# Patient Record
Sex: Male | Born: 1965 | Race: White | Hispanic: No | Marital: Single | Smoking: Never smoker
Health system: Southern US, Community
[De-identification: ages and names within clinical notes are randomized; demographics above are authoritative.]

## PROBLEM LIST (undated history)

## (undated) DIAGNOSIS — E7800 Pure hypercholesterolemia, unspecified: Secondary | ICD-10-CM

## (undated) DIAGNOSIS — E119 Type 2 diabetes mellitus without complications: Secondary | ICD-10-CM

## (undated) DIAGNOSIS — F410 Panic disorder [episodic paroxysmal anxiety] without agoraphobia: Secondary | ICD-10-CM

## (undated) DIAGNOSIS — G459 Transient cerebral ischemic attack, unspecified: Secondary | ICD-10-CM

## (undated) DIAGNOSIS — F419 Anxiety disorder, unspecified: Secondary | ICD-10-CM

## (undated) DIAGNOSIS — I10 Essential (primary) hypertension: Secondary | ICD-10-CM

## (undated) HISTORY — PX: APPENDECTOMY: SHX54

---
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
## 2014-11-28 ENCOUNTER — Other Ambulatory Visit: Payer: Self-pay

## 2018-04-07 ENCOUNTER — Emergency Department (HOSPITAL_COMMUNITY)
Admission: EM | Admit: 2018-04-07 | Discharge: 2018-04-08 | Disposition: A | Discharge status: Home / Self Care | Payer: Medicare Other | Attending: Emergency Medicine | Admitting: Emergency Medicine

## 2018-04-07 ENCOUNTER — Encounter (HOSPITAL_COMMUNITY): Payer: Self-pay

## 2018-04-07 DIAGNOSIS — E1165 Type 2 diabetes mellitus with hyperglycemia: Secondary | ICD-10-CM | POA: Diagnosis not present

## 2018-04-07 DIAGNOSIS — Z794 Long term (current) use of insulin: Secondary | ICD-10-CM | POA: Insufficient documentation

## 2018-04-07 DIAGNOSIS — R079 Chest pain, unspecified: Secondary | ICD-10-CM | POA: Insufficient documentation

## 2018-04-07 DIAGNOSIS — Z79899 Other long term (current) drug therapy: Secondary | ICD-10-CM | POA: Insufficient documentation

## 2018-04-07 DIAGNOSIS — R739 Hyperglycemia, unspecified: Secondary | ICD-10-CM

## 2018-04-07 DIAGNOSIS — Z7901 Long term (current) use of anticoagulants: Secondary | ICD-10-CM | POA: Diagnosis not present

## 2018-04-07 HISTORY — DX: Anxiety disorder, unspecified: F41.9

## 2018-04-07 HISTORY — DX: Essential (primary) hypertension: I10

## 2018-04-07 HISTORY — DX: Type 2 diabetes mellitus without complications: E11.9

## 2018-04-07 HISTORY — DX: Panic disorder (episodic paroxysmal anxiety): F41.0

## 2018-04-07 NOTE — ED Triage Notes (Signed)
Pt states he was seen at [facility] on Wednesday for same complaints, states had multiple tests and sent home.  Pt denies knowing what was wrong with him and continues to have chest pain and left arm numbness.

## 2018-04-08 ENCOUNTER — Emergency Department (HOSPITAL_COMMUNITY): Payer: Medicare Other

## 2018-04-08 ENCOUNTER — Encounter (HOSPITAL_COMMUNITY): Payer: Self-pay

## 2018-04-08 LAB — I-STAT CHEM 8, ED
BUN: 18 mg/dL (ref 6–20)
Calcium, Ion: 1.18 mmol/L (ref 1.15–1.40)
Chloride: 99 mmol/L (ref 98–111)
Creatinine, Ser: 1.3 mg/dL — ABNORMAL HIGH (ref 0.61–1.24)
GLUCOSE: 338 mg/dL — AB (ref 70–99)
HCT: 42 % (ref 39.0–52.0)
HEMOGLOBIN: 14.3 g/dL (ref 13.0–17.0)
Potassium: 3.3 mmol/L — ABNORMAL LOW (ref 3.5–5.1)
Sodium: 134 mmol/L — ABNORMAL LOW (ref 135–145)
TCO2: 23 mmol/L (ref 22–32)

## 2018-04-08 LAB — I-STAT TROPONIN, ED
TROPONIN I, POC: 0 ng/mL (ref 0.00–0.08)
Troponin i, poc: 0 ng/mL (ref 0.00–0.08)

## 2018-04-08 MED ORDER — PANTOPRAZOLE SODIUM 40 MG PO TBEC: 40.0000 mg | DELAYED_RELEASE_TABLET | Freq: Every day | ORAL | 3 refills | Status: DC

## 2018-04-08 NOTE — Discharge Instructions (Addendum)
See your Physician for recheck.  °

## 2018-04-08 NOTE — ED Notes (Addendum)
Dr. Blinda Leatherwood notified about potassium of 3.3

## 2018-04-08 NOTE — ED Provider Notes (Signed)
[facility] EMERGENCY DEPARTMENT Provider Note   CSN: [id number] Arrival date & time: 04/07/18  2341     History   Chief Complaint Chief Complaint  Patient presents with  . Chest Pain  . Numbness    HPI Todd Campbell is a 52 y.o. male.  The history is provided by the patient. No language interpreter was used.  Chest Pain   This is a new problem. The current episode started 2 days ago. The problem occurs constantly. The problem has been gradually worsening. The pain is associated with exertion. The pain is moderate. The pain radiates to the left shoulder and left arm. He has tried nothing for the symptoms. Risk factors include male gender and smoking/tobacco exposure.    Past Medical History:  Diagnosis Date  . Anxiety   . Diabetes mellitus without complication (HCC)   . Hypertension   . Panic attacks     There are no active problems to display for this patient.   Past Surgical History:  Procedure Laterality Date  . EYE SURGERY  1985        Home Medications    Prior to Admission medications   Medication Sig Start Date End Date Taking? Authorizing Provider  amLODipine (NORVASC) 10 MG tablet  04/06/18   [provider]  clonazePAM (KLONOPIN) 1 MG tablet  04/05/18   [provider]  clopidogrel (PLAVIX) 75 MG tablet  04/06/18   [provider]  gabapentin (NEURONTIN) 800 MG tablet 3 (three) times daily.  03/20/18   [provider]  LEVEMIR FLEXTOUCH 100 UNIT/ML Pen  04/03/18   [provider]  lisinopril-hydrochlorothiazide (PRINZIDE,ZESTORETIC) 10-12.5 MG tablet  03/13/18   [provider]  NOVOLOG MIX 70/30 FLEXPEN (70-30) 100 UNIT/ML FlexPen  03/20/18   [provider]  rosuvastatin (CRESTOR) 20 MG tablet  03/13/18   [provider]    Family History Family History  Problem Relation Age of Onset  . Diabetes Mother   . Heart attack Mother   . Diabetes Father   . Heart attack Father      Social History Social History   Tobacco Use  . Smoking status: Not on file  Substance Use Topics  . Alcohol use: Not on file  . Drug use: Not on file     Allergies   Peanut-containing drug products   Review of Systems Review of Systems  Cardiovascular: Positive for chest pain.  All other systems reviewed and are negative.    Physical Exam Updated Vital Signs BP (!) 132/91 (BP Location: Left Arm)   Pulse 89   Temp 98.3 F (36.8 C) (Oral)   Resp 18   Ht 5\' 7"  (1.702 m)   Wt 81.6 kg   SpO2 98%   BMI 28.19 kg/m   Physical Exam  Constitutional: He appears well-developed and well-nourished.  HENT:  Head: Normocephalic and atraumatic.  Eyes: Pupils are equal, round, and reactive to light. Conjunctivae and EOM are normal.  Neck: Neck supple.  Cardiovascular: Normal rate, regular rhythm and normal pulses.  No murmur heard. Pulmonary/Chest: Effort normal and breath sounds normal. No respiratory distress. He has no decreased breath sounds.  Abdominal: Soft. There is no tenderness.  Musculoskeletal: Normal range of motion. He exhibits no edema.  Neurological: He is alert.  Skin: Skin is warm and dry.  Psychiatric: He has a normal mood and affect.  Nursing note and vitals reviewed.    ED Treatments / Results  Labs (all labs ordered are  listed, but only abnormal results are displayed) Labs Reviewed  I-STAT TROPONIN, ED  I-STAT CHEM 8, ED    EKG None  Radiology Dg Chest 2 View  Result Date: 04/08/2018 CLINICAL DATA:  Chest pain. EXAM: CHEST - 2 VIEW COMPARISON:  None. FINDINGS: Size limited due to low volumes. No pneumothorax. No nodule or mass. No focal infiltrate. The cardiomediastinal silhouette is unremarkable. Mild anterior wedging of lower thoracic vertebral bodies is age indeterminate but most likely nonacute. IMPRESSION: 1. Limited low volume study with no acute abnormality in the lungs identified. 2. Mild age-indeterminate wedging of lower thoracic  vertebral bodies is favored to be nonacute. Recommend clinical correlation. Electronically Signed   By: Gerome Sam III M.D   On: 04/08/2018 00:46    Procedures Procedures (including critical care time)  Medications Ordered in ED Medications - No data to display   Initial Impression / Assessment and Plan / ED Course  I have reviewed the triage vital signs and the nursing notes.  Pertinent labs & imaging results that were available during my care of the patient were reviewed by me and considered in my medical decision making (see chart for details).       Final Clinical Impressions(s) / ED Diagnoses   Final diagnoses:  Nonspecific chest pain  Hyperglycemia    ED Discharge Orders    None       Elson Areas, [place] 04/08/18 0108    Gilda Crease, MD 04/08/18 [phone]

## 2018-05-15 ENCOUNTER — Emergency Department (HOSPITAL_COMMUNITY)
Admission: EM | Admit: 2018-05-15 | Discharge: 2018-05-16 | Disposition: A | Discharge status: Home / Self Care | Payer: Medicare Other | Attending: Emergency Medicine | Admitting: Emergency Medicine

## 2018-05-15 ENCOUNTER — Emergency Department (HOSPITAL_COMMUNITY): Payer: Medicare Other

## 2018-05-15 ENCOUNTER — Other Ambulatory Visit: Payer: Self-pay

## 2018-05-15 ENCOUNTER — Encounter (HOSPITAL_COMMUNITY): Payer: Self-pay | Admitting: *Deleted

## 2018-05-15 DIAGNOSIS — Z91010 Allergy to peanuts: Secondary | ICD-10-CM | POA: Diagnosis not present

## 2018-05-15 DIAGNOSIS — R079 Chest pain, unspecified: Secondary | ICD-10-CM | POA: Diagnosis present

## 2018-05-15 DIAGNOSIS — R1084 Generalized abdominal pain: Secondary | ICD-10-CM | POA: Diagnosis not present

## 2018-05-15 DIAGNOSIS — E119 Type 2 diabetes mellitus without complications: Secondary | ICD-10-CM | POA: Diagnosis not present

## 2018-05-15 DIAGNOSIS — Z79899 Other long term (current) drug therapy: Secondary | ICD-10-CM | POA: Insufficient documentation

## 2018-05-15 DIAGNOSIS — R0789 Other chest pain: Secondary | ICD-10-CM

## 2018-05-15 DIAGNOSIS — K920 Hematemesis: Secondary | ICD-10-CM | POA: Diagnosis not present

## 2018-05-15 DIAGNOSIS — R110 Nausea: Secondary | ICD-10-CM | POA: Insufficient documentation

## 2018-05-15 DIAGNOSIS — I10 Essential (primary) hypertension: Secondary | ICD-10-CM | POA: Diagnosis not present

## 2018-05-15 DIAGNOSIS — Z794 Long term (current) use of insulin: Secondary | ICD-10-CM | POA: Diagnosis not present

## 2018-05-15 NOTE — ED Notes (Signed)
Pt also c/o of blurry vision that started today

## 2018-05-15 NOTE — ED Triage Notes (Signed)
Pt has multiple complaints; pt c/o left side chest pain with numbness to bilateral arms; pt also c/o abdominal pain x one week; pt states he has been vomiting bright red blood that started today

## 2018-05-16 ENCOUNTER — Emergency Department (HOSPITAL_COMMUNITY): Payer: Medicare Other

## 2018-05-16 LAB — CBC
HEMATOCRIT: 36.5 % — AB (ref 39.0–52.0)
Hemoglobin: 11.7 g/dL — ABNORMAL LOW (ref 13.0–17.0)
MCH: 27.6 pg (ref 26.0–34.0)
MCHC: 32.1 g/dL (ref 30.0–36.0)
MCV: 86.1 fL (ref 80.0–100.0)
NRBC: 0 % (ref 0.0–0.2)
PLATELETS: 204 10*3/uL (ref 150–400)
RBC: 4.24 MIL/uL (ref 4.22–5.81)
RDW: 12.7 % (ref 11.5–15.5)
WBC: 6.6 10*3/uL (ref 4.0–10.5)

## 2018-05-16 LAB — COMPREHENSIVE METABOLIC PANEL
ALBUMIN: 3.3 g/dL — AB (ref 3.5–5.0)
ALT: 23 U/L (ref 0–44)
ANION GAP: 7 (ref 5–15)
AST: 17 U/L (ref 15–41)
Alkaline Phosphatase: 76 U/L (ref 38–126)
BUN: 17 mg/dL (ref 6–20)
CO2: 21 mmol/L — AB (ref 22–32)
Calcium: 8.4 mg/dL — ABNORMAL LOW (ref 8.9–10.3)
Chloride: 108 mmol/L (ref 98–111)
Creatinine, Ser: 1.45 mg/dL — ABNORMAL HIGH (ref 0.61–1.24)
GFR calc Af Amer: >60 mL/min (ref >60)
GFR calc non Af Amer: 55 mL/min — ABNORMAL LOW (ref >60)
GLUCOSE: 347 mg/dL — AB (ref 70–99)
POTASSIUM: 3.8 mmol/L (ref 3.5–5.1)
Sodium: 136 mmol/L (ref 135–145)
Total Bilirubin: 0.5 mg/dL (ref 0.3–1.2)
Total Protein: 6.2 g/dL — ABNORMAL LOW (ref 6.5–8.1)

## 2018-05-16 LAB — I-STAT TROPONIN, ED: Troponin i, poc: 0 ng/mL (ref 0.00–0.08)

## 2018-05-16 LAB — LIPASE, BLOOD: Lipase: 76 U/L — ABNORMAL HIGH (ref 11–51)

## 2018-05-16 LAB — APTT: APTT: 27 s (ref 24–36)

## 2018-05-16 LAB — PROTIME-INR
INR: 0.92
PROTHROMBIN TIME: 12.3 s (ref 11.4–15.2)

## 2018-05-16 LAB — POC OCCULT BLOOD, ED: Fecal Occult Bld: NEGATIVE

## 2018-05-16 MED ORDER — IOPAMIDOL (ISOVUE-300) INJECTION 61%
100.0000 mL | Freq: Once | INTRAVENOUS | Status: AC | PRN
  Administered 2018-05-16: 100 mL via INTRAVENOUS

## 2018-05-16 MED ORDER — OMEPRAZOLE 20 MG PO CPDR: DELAYED_RELEASE_CAPSULE | ORAL | 0 refills | Status: DC

## 2018-05-16 MED ORDER — CYCLOBENZAPRINE HCL 10 MG PO TABS
5.0000 mg | ORAL_TABLET | Freq: Once | ORAL | Status: AC
  Administered 2018-05-16: 5 mg via ORAL
  Filled 2018-05-16: qty 1

## 2018-05-16 MED ORDER — CYCLOBENZAPRINE HCL 5 MG PO TABS: 5.0000 mg | ORAL_TABLET | Freq: Three times a day (TID) | ORAL | 0 refills | Status: DC | PRN

## 2018-05-16 NOTE — ED Provider Notes (Signed)
[facility] EMERGENCY DEPARTMENT Provider Note   CSN: [id number] Arrival date & time: 05/15/18  2324  Time seen 23:52 PM   History   Chief Complaint Chief Complaint  Patient presents with  . Chest Pain    HPI Todd Campbell is a 52 y.o. male.  HPI patient reports he has had chest pain that started 2 days ago.  He points to his left mid central chest as the area that hurts.  He describes it as aching and throbbing.  He states the pain has been there constantly since it started.  Nothing makes it feel worse, nothing makes it feel better.  He is tried nothing for the pain.  He states he has had shortness of breath today.  He also states he has had a cough for the past 4 to 5 days and is coughing up yellow-brown mucus.  He states he had similar pain about a month ago otherwise he is never had it before.  He states today he has had nausea and vomiting twice and he states both times he coughed up at least a cup of blood.  He denies any melena.  He states he has had watery diarrhea x3 today.  He describes central and left upper quadrant abdominal pain for the past 3 days that is there constantly.  He denies any alcohol use.  He states he has had similar pain but this is worse.  He also states both arms feel numb today but this is worse.  He states he gets tingling of his fingers but gabapentin helps that.  Patient states he was admitted to [facility] in [place] on October 12 for pneumonia.  He was seen in our ED on October 5 after he had been admitted to [facility] to evaluate him for chest pain and numbness.  He states "they do not do anything".  However he had a pretty thorough evaluation.  They did a neurology consultation, head CT, MRI brain, and EEG.  These were all normal.  He also was evaluated for his chest pain with serial EKGs and and troponins.  He states he has had diabetes about 5 years and he is on insulin.  Although Plavix is listed as 1 of his medications he states he  has never been on it.  He states his mother had a "mild heart attack".  And diabetes runs on both sides of his family.  Patient himself has never had a diagnosis of coronary artery disease, TIA, or stroke.  PCP Joette Catching in [place]  Past Medical History:  Diagnosis Date  . Anxiety   . Diabetes mellitus without complication (HCC)   . Hypertension   . Panic attacks     There are no active problems to display for this patient.   Past Surgical History:  Procedure Laterality Date  . EYE SURGERY  1985        Home Medications    Prior to Admission medications   Medication Sig Start Date End Date Taking? Authorizing Provider  amLODipine (NORVASC) 10 MG tablet  04/06/18   [provider]  clonazePAM (KLONOPIN) 1 MG tablet  04/05/18   [provider]  clopidogrel (PLAVIX) 75 MG tablet  04/06/18   [provider]  cyclobenzaprine (FLEXERIL) 5 MG tablet Take 1 tablet (5 mg total) by mouth 3 (three) times daily as needed. 05/16/18   Devoria Albe, MD  gabapentin (NEURONTIN) 800 MG tablet 3 (three) times daily.  03/20/18   [provider]  LEVEMIR FLEXTOUCH 100 UNIT/ML Pen  04/03/18   [provider]  lisinopril-hydrochlorothiazide (PRINZIDE,ZESTORETIC) 10-12.5 MG tablet  03/13/18   [provider]  NOVOLOG MIX 70/30 FLEXPEN (70-30) 100 UNIT/ML FlexPen  03/20/18   [provider]  omeprazole (PRILOSEC) 20 MG capsule Take 1 po BID x 2 weeks then once a day 05/16/18   Devoria Albe, MD  pantoprazole (PROTONIX) 40 MG tablet Take 1 tablet (40 mg total) by mouth daily. 04/08/18   Gilda Crease, MD  rosuvastatin (CRESTOR) 20 MG tablet  03/13/18   [provider]    Family History Family History  Problem Relation Age of Onset  . Diabetes Mother   . Heart attack Mother   . Diabetes Father   . Heart attack Father     Social History Social History   Tobacco Use  . Smoking status: Never Smoker  . Smokeless tobacco: Current  User    Types: Chew  Substance Use Topics  . Alcohol use: Not on file  . Drug use: Not on file  Patient has been on disability for 5 years because of panic attacks.  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  He denies smoking however he is noted to have tobacco chew in his mouth   Allergies   Peanut-containing drug products   Review of Systems Review of Systems  All other systems reviewed and are negative.    Physical Exam Updated Vital Signs BP 102/65   Pulse 73   Temp (!) 97.5 F (36.4 C) (Oral)   Resp (!) 21   Ht 5\' 7"  (1.702 m)   Wt 86.2 kg   SpO2 97%   BMI 29.76 kg/m   Vital signs normal    Physical Exam  Constitutional: He is oriented to person, place, and time. He appears well-developed and well-nourished.  Non-toxic appearance. He does not appear ill. No distress.  HENT:  Head: Normocephalic and atraumatic.  Right Ear: External ear normal.  Left Ear: External ear normal.  Nose: Nose normal. No mucosal edema or rhinorrhea.  Mouth/Throat: Oropharynx is clear and moist and mucous membranes are normal. No dental abscesses or uvula swelling.  Eyes: Pupils are equal, round, and reactive to light. Conjunctivae and EOM are normal.  Neck: Normal range of motion and full passive range of motion without pain. Neck supple.  Cardiovascular: Normal rate, regular rhythm and normal heart sounds. Exam reveals no gallop and no friction rub.  No murmur heard. Pulmonary/Chest: Effort normal and breath sounds normal. No respiratory distress. He has no wheezes. He has no rhonchi. He has no rales. He exhibits no tenderness and no crepitus.    Abdominal: Soft. Normal appearance and bowel sounds are normal. He exhibits no distension. There is no tenderness. There is no rebound and no guarding.    Musculoskeletal: Normal range of motion. He exhibits no edema or tenderness.  Moves all extremities well.   Neurological: He is alert and oriented to person, place,  and time. He has normal strength. No cranial nerve deficit.  Skin: Skin is warm, dry and intact. No rash noted. No erythema. No pallor.  Patient is noted to have multiple fresh appearing needlesticks in both antecubital spaces.  When I asked the patient about it he states those are from blood draws he had done in October when he was admitted to [facility] for pneumonia.  He also was noted to have tape marks in the same areas.  These needle marks look very fresh.  Psychiatric: He has a normal mood and affect. His speech is normal and behavior is normal. His mood appears not anxious.  Nursing note and vitals reviewed.    ED Treatments / Results  Labs (all labs ordered are listed, but only abnormal results are displayed)  Results for orders placed or performed during the hospital encounter of 05/15/18  CBC  Result Value Ref Range   WBC 6.6 4.0 - 10.5 K/uL   RBC 4.24 4.22 - 5.81 MIL/uL   Hemoglobin 11.7 (L) 13.0 - 17.0 g/dL   HCT 69.636.5 (L) 29.539.0 - 28.452.0 %   MCV 86.1 80.0 - 100.0 fL   MCH 27.6 26.0 - 34.0 pg   MCHC 32.1 30.0 - 36.0 g/dL   RDW 13.212.7 44.011.5 - 10.215.5 %   Platelets 204 150 - 400 K/uL   nRBC 0.0 0.0 - 0.2 %  Comprehensive metabolic panel  Result Value Ref Range   Sodium 136 135 - 145 mmol/L   Potassium 3.8 3.5 - 5.1 mmol/L   Chloride 108 98 - 111 mmol/L   CO2 21 (L) 22 - 32 mmol/L   Glucose, Bld 347 (H) 70 - 99 mg/dL   BUN 17 6 - 20 mg/dL   Creatinine, Ser 7.251.45 (H) 0.61 - 1.24 mg/dL   Calcium 8.4 (L) 8.9 - 10.3 mg/dL   Total Protein 6.2 (L) 6.5 - 8.1 g/dL   Albumin 3.3 (L) 3.5 - 5.0 g/dL   AST 17 15 - 41 U/L   ALT 23 0 - 44 U/L   Alkaline Phosphatase 76 38 - 126 U/L   Total Bilirubin 0.5 0.3 - 1.2 mg/dL   GFR calc non Af Amer 55 (L) >60 mL/min   GFR calc Af Amer >60 >60 mL/min   Anion gap 7 5 - 15  Lipase, blood  Result Value Ref Range   Lipase 76 (H) 11 - 51 U/L  Protime-INR  Result Value Ref Range   Prothrombin Time 12.3 11.4 - 15.2 seconds   INR 0.92   APTT    Result Value Ref Range   aPTT 27 24 - 36 seconds  I-stat troponin, ED  Result Value Ref Range   Troponin i, poc 0.00 0.00 - 0.08 ng/mL   Comment 3          POC occult blood, ED RN will collect  Result Value Ref Range   Fecal Occult Bld NEGATIVE NEGATIVE   Laboratory interpretation all normal except mild anemia with nothing to compare to, mild renal insufficiency.  Patient's BUN is noted to be well within normal, with a upper GI bleed I would expect the BUN to be elevated.   EKG EKG Interpretation  Date/Time:  Monday May 15 2018 23:48:18 EST Ventricular Rate:  82 PR Interval:    QRS Duration: 108 QT Interval:  371 QTC Calculation: 434 R Axis:   8 Text Interpretation:  Sinus rhythm Inferior infarct, old Consider anterior infarct Baseline wander in lead(s) III No significant change since last tracing 07 Apr 2018 Confirmed by Devoria AlbeKnapp, Dannae Kato ([id number]) on 05/15/2018 11:50:30 PM   Radiology Dg Chest 2 View  Result Date: 05/16/2018 CLINICAL DATA:  Acute onset of worsening shortness of breath and left-sided chest pain. Productive cough. EXAM: CHEST - 2 VIEW COMPARISON:  Chest radiograph performed 04/08/2018 FINDINGS: The lungs are hypoexpanded. Mild left basilar opacity may reflect atelectasis or possibly mild pneumonia, depending on the patient's symptoms. There is no evidence of significant pleural  effusion or pneumothorax. The heart is mildly enlarged. No acute osseous abnormalities are seen. IMPRESSION: 1. Lungs hypoexpanded. Mild left basilar opacity may reflect atelectasis or possibly mild pneumonia, depending on the patient's symptoms. 2. Mild cardiomegaly. Electronically Signed   By: Roanna Raider M.D.   On: 05/16/2018 00:52   Ct Abdomen Pelvis W Contrast  Result Date: 05/16/2018 CLINICAL DATA:  Acute onset of generalized abdominal pain. Hematemesis. EXAM: CT ABDOMEN AND PELVIS WITH CONTRAST TECHNIQUE: Multidetector CT imaging of the abdomen and pelvis was performed using the  standard protocol following bolus administration of intravenous contrast. CONTRAST:  ISOVUE-300 IOPAMIDOL (ISOVUE-300) INJECTION 61% COMPARISON:  None. FINDINGS: Lower chest: The visualized lung bases are grossly clear. The visualized portions of the mediastinum are unremarkable. Hepatobiliary: The liver is unremarkable in appearance. The gallbladder is unremarkable in appearance. The common bile duct remains normal in caliber. Pancreas: The pancreas is within normal limits. Spleen: The spleen is unremarkable in appearance. Adrenals/Urinary Tract: The adrenal glands are unremarkable in appearance. Nonspecific perinephric stranding is noted bilaterally. There is no evidence of hydronephrosis. No renal or ureteral stones are identified. Stomach/Bowel: The appendix is dilated to 9 mm in diameter, but no soft tissue inflammation is seen. This is thought to reflect the patient's baseline. The colon is unremarkable. The small bowel is largely decompressed and grossly unremarkable in appearance. The stomach is within normal limits. Vascular/Lymphatic: Scattered calcification is seen along the abdominal aorta and its branches. The abdominal aorta is otherwise grossly unremarkable. The inferior vena cava is grossly unremarkable. No retroperitoneal lymphadenopathy is seen. No pelvic sidewall lymphadenopathy is identified. Reproductive: The bladder is moderately distended and grossly unremarkable. The prostate is normal in size. Other: No additional soft tissue abnormalities are seen. Musculoskeletal: No acute osseous abnormalities are identified. The visualized musculature is unremarkable in appearance. IMPRESSION: No acute abnormality seen within the abdomen or pelvis. Appendix is prominent but otherwise unremarkable in appearance, without definite evidence for appendicitis. Aortic Atherosclerosis (ICD10-I70.0). Electronically Signed   By: Roanna Raider M.D.   On: 05/16/2018 02:23    Procedures Procedures  (including critical care time)  Medications Ordered in ED Medications  iopamidol (ISOVUE-300) 61 % injection 100 mL (100 mLs Intravenous Contrast Given 05/16/18 0200)  cyclobenzaprine (FLEXERIL) tablet 5 mg (5 mg Oral Given 05/16/18 0220)     Initial Impression / Assessment and Plan / ED Course  I have reviewed the triage vital signs and the nursing notes.  Pertinent labs & imaging results that were available during my care of the patient were reviewed by me and considered in my medical decision making (see chart for details).     Patient is here with multiple cold complaints and most of them are things he has been recently evaluated for.  Patient does not appear to be in any distress.  Patient remains afebrile in the ED so I do not think he has pneumonia.  This may be residual of his prior pneumonia a couple weeks ago.  Patient's pain is better after getting the Flexeril.  When I went in to talk to him he was sound asleep.  He has had no vomiting in the ED, i.e. no hematemesis.  There is a empty Pepsi bottle at the bedside that he has been spitting his tobacco again.  We discussed his CT result, he denies having constipation however when I look at the CT scan he is noted to have a lot of stool in the small and large colon.  I advised  patient he needed to start following up with his primary care doctor. Please note patient has multiple fresh needle marks in his arms, he has a history of going to different hospitals, I suspect he has been seen for these same symptoms recently.  Final Clinical Impressions(s) / ED Diagnoses   Final diagnoses:  Chest wall pain  Generalized abdominal pain  Hematemesis with nausea    ED Discharge Orders         Ordered    cyclobenzaprine (FLEXERIL) 5 MG tablet  3 times daily PRN     05/16/18 0331    omeprazole (PRILOSEC) 20 MG capsule     05/16/18 0331         Plan discharge  Devoria Albe, MD, Concha Pyo, MD 05/16/18 [phone]

## 2018-05-16 NOTE — ED Notes (Signed)
I-Stat Troponin: 0.00 

## 2018-05-16 NOTE — Discharge Instructions (Signed)
Take the medication as prescribed.  Please contact your doctor to let them know about your ED visit tonight.  You will need further evaluation to try to find the source of your bleeding.  They may want to refer you to a stomach specialist.

## 2018-05-16 NOTE — ED Notes (Signed)
During IV insertion, noticed several needle marks to both a/c's that appear red and recent, when this nurse cleaned his arm to attempt IV access one of the needle marks began to bleed, left arm had 3 needle marks in a line to same vein and 2 to separate vein, right arm had 3 visible needle marks as well, veins in both arms are scarred, when asked about the needle marks the pt stated he had been in the hospital in October for pneumonia, he later stated "they've been drawing blood from different areas", while in the room pt also requests Morphine for "belly pain", advised pt that this nurse would speak with EDP and questioned his chief complaint of chest pain, pt states he does have some chest pain but has also had abd pain for about a week; advised will speak with EDP, pt again states "I think some Morphine will really help with my belly pain", EDP notified

## 2018-05-16 NOTE — ED Notes (Signed)
Notified by lab tech that pt requests Morphine

## 2018-07-14 ENCOUNTER — Other Ambulatory Visit: Payer: Self-pay

## 2018-07-14 ENCOUNTER — Emergency Department (HOSPITAL_COMMUNITY): Payer: Medicare Other

## 2018-07-14 ENCOUNTER — Encounter (HOSPITAL_COMMUNITY): Payer: Self-pay | Admitting: *Deleted

## 2018-07-14 ENCOUNTER — Emergency Department (HOSPITAL_COMMUNITY)
Admission: EM | Admit: 2018-07-14 | Discharge: 2018-07-14 | Disposition: A | Discharge status: Home / Self Care | Payer: Medicare Other | Attending: Emergency Medicine | Admitting: Emergency Medicine

## 2018-07-14 DIAGNOSIS — R739 Hyperglycemia, unspecified: Secondary | ICD-10-CM

## 2018-07-14 DIAGNOSIS — Z794 Long term (current) use of insulin: Secondary | ICD-10-CM | POA: Diagnosis not present

## 2018-07-14 DIAGNOSIS — Z7902 Long term (current) use of antithrombotics/antiplatelets: Secondary | ICD-10-CM | POA: Diagnosis not present

## 2018-07-14 DIAGNOSIS — F419 Anxiety disorder, unspecified: Secondary | ICD-10-CM | POA: Insufficient documentation

## 2018-07-14 DIAGNOSIS — R109 Unspecified abdominal pain: Secondary | ICD-10-CM

## 2018-07-14 DIAGNOSIS — Z79899 Other long term (current) drug therapy: Secondary | ICD-10-CM | POA: Diagnosis not present

## 2018-07-14 DIAGNOSIS — I10 Essential (primary) hypertension: Secondary | ICD-10-CM | POA: Diagnosis not present

## 2018-07-14 DIAGNOSIS — E1165 Type 2 diabetes mellitus with hyperglycemia: Secondary | ICD-10-CM | POA: Insufficient documentation

## 2018-07-14 DIAGNOSIS — R1031 Right lower quadrant pain: Secondary | ICD-10-CM | POA: Diagnosis present

## 2018-07-14 DIAGNOSIS — Z91010 Allergy to peanuts: Secondary | ICD-10-CM | POA: Insufficient documentation

## 2018-07-14 HISTORY — DX: Pure hypercholesterolemia, unspecified: E78.00

## 2018-07-14 LAB — URINALYSIS, ROUTINE W REFLEX MICROSCOPIC
Bacteria, UA: NONE SEEN
Bilirubin Urine: NEGATIVE
Hgb urine dipstick: NEGATIVE
Ketones, ur: NEGATIVE mg/dL
Leukocytes, UA: NEGATIVE
Nitrite: NEGATIVE
Protein, ur: NEGATIVE mg/dL
Specific Gravity, Urine: 1.027 (ref 1.005–1.030)
pH: 6 (ref 5.0–8.0)

## 2018-07-14 LAB — COMPREHENSIVE METABOLIC PANEL
ALK PHOS: 98 U/L (ref 38–126)
ALT: 26 U/L (ref 0–44)
AST: 20 U/L (ref 15–41)
Albumin: 3.8 g/dL (ref 3.5–5.0)
Anion gap: 9 (ref 5–15)
BILIRUBIN TOTAL: 0.3 mg/dL (ref 0.3–1.2)
BUN: 14 mg/dL (ref 6–20)
CO2: 25 mmol/L (ref 22–32)
CREATININE: 1.53 mg/dL — AB (ref 0.61–1.24)
Calcium: 9.1 mg/dL (ref 8.9–10.3)
Chloride: 102 mmol/L (ref 98–111)
GFR calc Af Amer: >60 mL/min (ref >60)
GFR calc non Af Amer: 52 mL/min — ABNORMAL LOW (ref >60)
Glucose, Bld: 418 mg/dL — ABNORMAL HIGH (ref 70–99)
Potassium: 3.7 mmol/L (ref 3.5–5.1)
Sodium: 136 mmol/L (ref 135–145)
Total Protein: 7.3 g/dL (ref 6.5–8.1)

## 2018-07-14 LAB — CBC
HCT: 39.4 % (ref 39.0–52.0)
Hemoglobin: 12.4 g/dL — ABNORMAL LOW (ref 13.0–17.0)
MCH: 28.3 pg (ref 26.0–34.0)
MCHC: 31.5 g/dL (ref 30.0–36.0)
MCV: 90 fL (ref 80.0–100.0)
Platelets: 191 10*3/uL (ref 150–400)
RBC: 4.38 MIL/uL (ref 4.22–5.81)
RDW: 13.4 % (ref 11.5–15.5)
WBC: 5.1 10*3/uL (ref 4.0–10.5)
nRBC: 0 % (ref 0.0–0.2)

## 2018-07-14 LAB — LIPASE, BLOOD: Lipase: 41 U/L (ref 11–51)

## 2018-07-14 MED ORDER — ONDANSETRON HCL 4 MG/2ML IJ SOLN
4.0000 mg | Freq: Once | INTRAMUSCULAR | Status: AC
  Administered 2018-07-14: 4 mg via INTRAVENOUS
  Filled 2018-07-14: qty 2

## 2018-07-14 MED ORDER — MORPHINE SULFATE (PF) 4 MG/ML IV SOLN
4.0000 mg | Freq: Once | INTRAVENOUS | Status: AC
  Administered 2018-07-14: 4 mg via INTRAVENOUS
  Filled 2018-07-14: qty 1

## 2018-07-14 MED ORDER — IOPAMIDOL (ISOVUE-300) INJECTION 61%
100.0000 mL | Freq: Once | INTRAVENOUS | Status: AC | PRN
  Administered 2018-07-14: 100 mL via INTRAVENOUS

## 2018-07-14 MED ORDER — SODIUM CHLORIDE 0.9 % IV BOLUS
1000.0000 mL | Freq: Once | INTRAVENOUS | Status: AC
  Administered 2018-07-14: 1000 mL via INTRAVENOUS

## 2018-07-14 NOTE — ED Provider Notes (Signed)
[facility] EMERGENCY DEPARTMENT Provider Note   CSN: [id number] Arrival date & time: 07/14/18  1412     History   Chief Complaint Chief Complaint  Patient presents with  . Abdominal Pain    HPI Todd Campbell is a 53 y.o. male.  Right flank pain radiating to right lower quadrant, then to left lower quadrant, then to back for 2 to 3 weeks.  Pain is not associated with any activity.  Questionable burning with urination and nausea, but no vomiting, diarrhea, chills.  Patient reports a fever 5 days ago.  Severity of symptoms is mild to moderate.  Nothing makes symptoms better or worse.     Past Medical History:  Diagnosis Date  . Anxiety   . Diabetes mellitus without complication (HCC)   . High cholesterol   . Hypertension   . Panic attacks     There are no active problems to display for this patient.   Past Surgical History:  Procedure Laterality Date  . EYE SURGERY  1985        Home Medications    Prior to Admission medications   Medication Sig Start Date End Date Taking? Authorizing Provider  amLODipine (NORVASC) 10 MG tablet  04/06/18   [provider]  clonazePAM (KLONOPIN) 1 MG tablet  04/05/18   [provider]  clopidogrel (PLAVIX) 75 MG tablet  04/06/18   [provider]  cyclobenzaprine (FLEXERIL) 5 MG tablet Take 1 tablet (5 mg total) by mouth 3 (three) times daily as needed. 05/16/18   Devoria Albe, MD  gabapentin (NEURONTIN) 800 MG tablet 3 (three) times daily.  03/20/18   [provider]  LEVEMIR FLEXTOUCH 100 UNIT/ML Pen  04/03/18   [provider]  lisinopril-hydrochlorothiazide (PRINZIDE,ZESTORETIC) 10-12.5 MG tablet  03/13/18   [provider]  NOVOLOG MIX 70/30 FLEXPEN (70-30) 100 UNIT/ML FlexPen  03/20/18   [provider]  omeprazole (PRILOSEC) 20 MG capsule Take 1 po BID x 2 weeks then once a day 05/16/18   Devoria Albe, MD  pantoprazole (PROTONIX) 40 MG tablet Take 1 tablet (40 mg total) by  mouth daily. 04/08/18   Gilda Crease, MD  rosuvastatin (CRESTOR) 20 MG tablet  03/13/18   [provider]    Family History Family History  Problem Relation Age of Onset  . Diabetes Mother   . Heart attack Mother   . Diabetes Father   . Heart attack Father     Social History Social History   Tobacco Use  . Smoking status: Never Smoker  . Smokeless tobacco: Current User    Types: Chew  Substance Use Topics  . Alcohol use: Never    Frequency: Never  . Drug use: Never     Allergies   Peanut-containing drug products   Review of Systems Review of Systems  All other systems reviewed and are negative.    Physical Exam Updated Vital Signs BP 115/65   Pulse 71   Temp 97.7 F (36.5 C) (Oral)   Resp 16   Ht 5\' 7"  (1.702 m)   Wt 86.2 kg   SpO2 94%   BMI 29.76 kg/m   Physical Exam Vitals signs and nursing note reviewed.  Constitutional:      Appearance: He is well-developed.  HENT:     Head: Normocephalic and atraumatic.  Eyes:     Conjunctiva/sclera: Conjunctivae normal.  Neck:     Musculoskeletal: Neck supple.  Cardiovascular:     Rate and Rhythm: Normal  rate and regular rhythm.  Pulmonary:     Effort: Pulmonary effort is normal.     Breath sounds: Normal breath sounds.  Abdominal:     General: Bowel sounds are normal.     Palpations: Abdomen is soft.  Genitourinary:    Comments: Min right flank tenderness Musculoskeletal: Normal range of motion.  Skin:    General: Skin is warm and dry.  Neurological:     Mental Status: He is alert and oriented to person, place, and time.  Psychiatric:        Behavior: Behavior normal.      ED Treatments / Results  Labs (all labs ordered are listed, but only abnormal results are displayed) Labs Reviewed  COMPREHENSIVE METABOLIC PANEL - Abnormal; Notable for the following components:      Result Value   Glucose, Bld 418 (*)    Creatinine, Ser 1.53 (*)    GFR calc non Af Amer 52 (*)    All  other components within normal limits  CBC - Abnormal; Notable for the following components:   Hemoglobin 12.4 (*)    All other components within normal limits  URINALYSIS, ROUTINE W REFLEX MICROSCOPIC - Abnormal; Notable for the following components:   Color, Urine STRAW (*)    Glucose, UA >=500 (*)    All other components within normal limits  LIPASE, BLOOD    EKG None  Radiology Ct Abdomen Pelvis W Contrast  Result Date: 07/14/2018 CLINICAL DATA:  Abdominal pain and right flank pain. Nausea and dysuria. EXAM: CT ABDOMEN AND PELVIS WITH CONTRAST TECHNIQUE: Multidetector CT imaging of the abdomen and pelvis was performed using the standard protocol following bolus administration of intravenous contrast. CONTRAST:  ISOVUE-300 IOPAMIDOL (ISOVUE-300) INJECTION 61% COMPARISON:  CT scan dated 05/16/2018 FINDINGS: Lower chest: Normal. Hepatobiliary: No focal liver abnormality is seen. No gallstones, gallbladder wall thickening, or biliary dilatation. Pancreas: Unremarkable. No pancreatic ductal dilatation or surrounding inflammatory changes. Spleen: Normal in size without focal abnormality. Adrenals/Urinary Tract: Adrenal glands are unremarkable. Kidneys are normal, without renal calculi, focal lesion, or hydronephrosis. Bladder is unremarkable. Stomach/Bowel: Stomach is within normal limits. Appendix appears normal. No evidence of bowel wall thickening, distention, or inflammatory changes. Vascular/Lymphatic: Slight aortic atherosclerosis. No adenopathy. Reproductive: Prostate is unremarkable. Other: No abdominal wall hernia or abnormality. No abdominopelvic ascites. Musculoskeletal: No acute or significant osseous findings. IMPRESSION: Benign-appearing abdomen and pelvis. Electronically Signed   By: Francene Boyers M.D.   On: 07/14/2018 17:07    Procedures Procedures (including critical care time)  Medications Ordered in ED Medications  sodium chloride 0.9 % bolus 1,000 mL (0 mLs  Intravenous Stopped 07/14/18 1730)  ondansetron (ZOFRAN) injection 4 mg (4 mg Intravenous Given 07/14/18 1630)  morphine 4 MG/ML injection 4 mg (4 mg Intravenous Given 07/14/18 1630)  iopamidol (ISOVUE-300) 61 % injection 100 mL (100 mLs Intravenous Contrast Given 07/14/18 1646)     Initial Impression / Assessment and Plan / ED Course  I have reviewed the triage vital signs and the nursing notes.  Pertinent labs & imaging results that were available during my care of the patient were reviewed by me and considered in my medical decision making (see chart for details).     Patient presents with right flank pain and lower abdominal pain for 2 to 3 weeks.  White count normal.  Liver functions normal.  Glucose 418.  Urinalysis negative.  CT abdomen pelvis shows no acute pathology.  Discussed findings with patient.  Will return if  worse.  Final Clinical Impressions(s) / ED Diagnoses   Final diagnoses:  Abdominal pain, unspecified abdominal location  Hyperglycemia    ED Discharge Orders    None       Donnetta Hutchingook, Ginger Leeth, MD 07/14/18 1945

## 2018-07-14 NOTE — ED Triage Notes (Addendum)
Pt c/o abdominal pain that radiates around to his back, nausea, dysuria x 2 weeks. Denies vomiting and diarrhea. Pt reports he got dizzy headed a few days ago and fell x 2.

## 2018-07-14 NOTE — ED Notes (Signed)
Pt's friend is driving him home today.

## 2018-07-14 NOTE — Discharge Instructions (Addendum)
Your glucose is elevated (greater than 400), but the CT scan of your abdomen was read as normal.  Tylenol or ibuprofen for pain.  Follow-up with your primary care doctor.

## 2018-07-14 NOTE — ED Notes (Signed)
ED Provider at bedside. 

## 2018-08-16 ENCOUNTER — Encounter (HOSPITAL_COMMUNITY): Payer: Self-pay | Admitting: Emergency Medicine

## 2018-08-16 ENCOUNTER — Emergency Department (HOSPITAL_COMMUNITY)
Admission: EM | Admit: 2018-08-16 | Discharge: 2018-08-17 | Disposition: A | Discharge status: Home / Self Care | Payer: Medicare Other | Attending: Emergency Medicine | Admitting: Emergency Medicine

## 2018-08-16 ENCOUNTER — Other Ambulatory Visit: Payer: Self-pay

## 2018-08-16 ENCOUNTER — Emergency Department (HOSPITAL_COMMUNITY): Payer: Medicare Other

## 2018-08-16 DIAGNOSIS — I10 Essential (primary) hypertension: Secondary | ICD-10-CM | POA: Insufficient documentation

## 2018-08-16 DIAGNOSIS — Z91010 Allergy to peanuts: Secondary | ICD-10-CM | POA: Insufficient documentation

## 2018-08-16 DIAGNOSIS — Z794 Long term (current) use of insulin: Secondary | ICD-10-CM | POA: Insufficient documentation

## 2018-08-16 DIAGNOSIS — R079 Chest pain, unspecified: Secondary | ICD-10-CM | POA: Diagnosis not present

## 2018-08-16 DIAGNOSIS — I2510 Atherosclerotic heart disease of native coronary artery without angina pectoris: Secondary | ICD-10-CM | POA: Diagnosis not present

## 2018-08-16 DIAGNOSIS — E119 Type 2 diabetes mellitus without complications: Secondary | ICD-10-CM | POA: Insufficient documentation

## 2018-08-16 DIAGNOSIS — Z79899 Other long term (current) drug therapy: Secondary | ICD-10-CM | POA: Diagnosis not present

## 2018-08-16 LAB — BASIC METABOLIC PANEL
Anion gap: 8 (ref 5–15)
BUN: 11 mg/dL (ref 6–20)
CO2: 27 mmol/L (ref 22–32)
Calcium: 9.1 mg/dL (ref 8.9–10.3)
Chloride: 99 mmol/L (ref 98–111)
Creatinine, Ser: 0.99 mg/dL (ref 0.61–1.24)
GFR calc Af Amer: >60 mL/min (ref >60)
GFR calc non Af Amer: >60 mL/min (ref >60)
GLUCOSE: 324 mg/dL — AB (ref 70–99)
Potassium: 3 mmol/L — ABNORMAL LOW (ref 3.5–5.1)
Sodium: 134 mmol/L — ABNORMAL LOW (ref 135–145)

## 2018-08-16 LAB — CBC
HCT: 36.1 % — ABNORMAL LOW (ref 39.0–52.0)
HEMOGLOBIN: 11.7 g/dL — AB (ref 13.0–17.0)
MCH: 27.2 pg (ref 26.0–34.0)
MCHC: 32.4 g/dL (ref 30.0–36.0)
MCV: 84 fL (ref 80.0–100.0)
Platelets: 174 10*3/uL (ref 150–400)
RBC: 4.3 MIL/uL (ref 4.22–5.81)
RDW: 13.2 % (ref 11.5–15.5)
WBC: 6.9 10*3/uL (ref 4.0–10.5)
nRBC: 0 % (ref 0.0–0.2)

## 2018-08-16 LAB — TROPONIN I: Troponin I: <0.03 ng/mL (ref <0.03)

## 2018-08-16 NOTE — ED Notes (Addendum)
Pt notified CT tech April that he has a heart stent card. Did not find anything in pt history.

## 2018-08-16 NOTE — ED Triage Notes (Signed)
Pt c/o anxiety on and off since last night after another dog attacked and killed his dog. Pt c/o worsening cp x 3-4 hours with some sob. nad noted.

## 2018-08-16 NOTE — ED Provider Notes (Signed)
[facility] EMERGENCY DEPARTMENT Provider Note   CSN: [id number] Arrival date & time: 08/16/18  1809     History   Chief Complaint Chief Complaint  Patient presents with  . Chest Pain    HPI Todd Campbell is a 53 y.o. male with a history of anxiety, diabetes, hypertension, cholesterol and coronary artery disease, stating he had a stent placed in [place] 2 months ago presenting with increased anxiety in association with chest pain and numbness in his left arm which started today.  He reports numbness since mid afternoon today, then developed intermittent chest pain described as "sharp and dull", sometimes lasting a few minutes, other times lasting 30+ minutes and started around 5 PM today.  He reports similar symptoms with panic attacks and had a traumatic event yesterday when a neighborhood dog ran into their home and killed his [place].  He is currently chest pain free, but continues to have numbness in his left arm.  He denies weakness in the arm.  Additionally he denies diaphoresis, palpitations, nausea, vomiting or abdominal pain.  He has had no medications prior to arrival for his symptoms however endorses he is due for a Xanax tablet.  The history is provided by the patient.    Past Medical History:  Diagnosis Date  . Anxiety   . Diabetes mellitus without complication (HCC)   . High cholesterol   . Hypertension   . Panic attacks     There are no active problems to display for this patient.   Past Surgical History:  Procedure Laterality Date  . EYE SURGERY  1985        Home Medications    Prior to Admission medications   Medication Sig Start Date End Date Taking? Authorizing Provider  ALPRAZolam Prudy Feeler) 1 MG tablet Take 1 mg by mouth 3 (three) times daily.   Yes [provider]  gabapentin (NEURONTIN) 800 MG tablet Take 800 mg by mouth 3 (three) times daily.   Yes [provider]  LEVEMIR FLEXTOUCH 100 UNIT/ML Pen Inject 15 Units into  the skin 2 (two) times daily.  04/03/18  Yes [provider]  lisinopril (PRINIVIL,ZESTRIL) 5 MG tablet Take 10 mg by mouth daily. 05/11/18  Yes [provider]  nitroGLYCERIN (NITROSTAT) 0.4 MG SL tablet Place 0.4 mg under the tongue every 5 (five) minutes as needed for chest pain.  04/14/18  Yes [provider]  NOVOLOG MIX 70/30 FLEXPEN (70-30) 100 UNIT/ML FlexPen Inject 10 Units into the skin 3 (three) times daily after meals.  03/20/18  Yes [provider]  omeprazole (PRILOSEC) 20 MG capsule Take 1 po BID x 2 weeks then once a day Patient taking differently: Take 20 mg by mouth 2 (two) times daily before a meal.  05/16/18  Yes Devoria Albe, MD  rosuvastatin (CRESTOR) 40 MG tablet Take 40 mg by mouth every evening.   Yes [provider]    Family History Family History  Problem Relation Age of Onset  . Diabetes Mother   . Heart attack Mother   . Diabetes Father   . Heart attack Father     Social History Social History   Tobacco Use  . Smoking status: Never Smoker  . Smokeless tobacco: Current User    Types: Chew  Substance Use Topics  . Alcohol use: Never    Frequency: Never  . Drug use: Never     Allergies   Peanut-containing drug products   Review of Systems Review of  Systems  Constitutional: Negative for fever.  HENT: Negative for congestion and sore throat.   Eyes: Negative.   Respiratory: Negative for shortness of breath.   Cardiovascular: Positive for chest pain. Negative for palpitations and leg swelling.  Gastrointestinal: Negative for abdominal pain, nausea and vomiting.  Genitourinary: Negative.   Musculoskeletal: Negative for arthralgias, joint swelling and neck pain.  Skin: Negative.  Negative for rash and wound.  Neurological: Negative for dizziness, weakness, light-headedness, numbness and headaches.  Psychiatric/Behavioral: The patient is nervous/anxious.      Physical Exam Updated Vital Signs BP (!)  147/90   Pulse (!) 53   Temp 97.7 F (36.5 C)   Resp 15   Ht 5\' 7"  (1.702 m)   Wt 86.2 kg   SpO2 96%   BMI 29.76 kg/m   Physical Exam Vitals signs and nursing note reviewed.  Constitutional:      Appearance: He is well-developed.  HENT:     Head: Normocephalic and atraumatic.  Eyes:     Conjunctiva/sclera: Conjunctivae normal.  Neck:     Musculoskeletal: Normal range of motion.  Cardiovascular:     Rate and Rhythm: Normal rate and regular rhythm.     Heart sounds: Normal heart sounds.  Pulmonary:     Effort: Pulmonary effort is normal.     Breath sounds: Normal breath sounds. No wheezing.  Abdominal:     General: Bowel sounds are normal.     Palpations: Abdomen is soft.     Tenderness: There is no abdominal tenderness.  Musculoskeletal: Normal range of motion.     Right lower leg: He exhibits no tenderness. No edema.     Left lower leg: He exhibits no tenderness. No edema.  Skin:    General: Skin is warm and dry.  Neurological:     Mental Status: He is alert.      ED Treatments / Results  Labs (all labs ordered are listed, but only abnormal results are displayed) Labs Reviewed  BASIC METABOLIC PANEL - Abnormal; Notable for the following components:      Result Value   Sodium 134 (*)    Potassium 3.0 (*)    Glucose, Bld 324 (*)    All other components within normal limits  CBC - Abnormal; Notable for the following components:   Hemoglobin 11.7 (*)    HCT 36.1 (*)    All other components within normal limits  TROPONIN I  TROPONIN I    EKG EKG Interpretation  Date/Time:  Wednesday August 16 2018 18:34:29 EST Ventricular Rate:  55 PR Interval:  180 QRS Duration: 106 QT Interval:  392 QTC Calculation: 375 R Axis:   -31 Text Interpretation:  Sinus bradycardia Left axis deviation Cannot rule out Anterior infarct , age undetermined ST & T wave abnormality, consider lateral ischemia Abnormal ECG Interpretation limited secondary to artifact Confirmed by  Zadie RhineWickline, Donald ([id number]) on 08/16/2018 11:43:31 PM   Radiology Dg Chest 2 View  Result Date: 08/16/2018 CLINICAL DATA:  53 year old male with history of anxiety EXAM: CHEST - 2 VIEW COMPARISON:  05/17/1999 FINDINGS: Cardiomediastinal silhouette unchanged in size and contour. No evidence of central vascular congestion. No pneumothorax or pleural effusion. No confluent airspace disease. No displaced fracture. Degenerative changes thoracic spine. IMPRESSION: Negative for acute cardiopulmonary disease Electronically Signed   By: Gilmer MorJaime  Wagner D.O.   On: 08/16/2018 19:54    Procedures Procedures (including critical care time)  Medications Ordered in ED Medications  ALPRAZolam (XANAX) tablet 1 mg (has  no administration in time range)  aspirin chewable tablet 324 mg (has no administration in time range)     Initial Impression / Assessment and Plan / ED Course  I have reviewed the triage vital signs and the nursing notes.  Pertinent labs & imaging results that were available during my care of the patient were reviewed by me and considered in my medical decision making (see chart for details).     Pt with chest pain with left arm radiculopathy and hx of CAD with recent stent placement ([place]  Stress/anxiety induced but will need admission for cardiac rule out.    12:43 AM Prior to call for admission, nursing staff observed pt leave the dept with coat on out the back hallway. Attempt to find pt unsuccessful.  Final Clinical Impressions(s) / ED Diagnoses   Final diagnoses:  Chest pain, unspecified type    ED Discharge Orders    None       Victoriano Lain 08/17/18 0043    Zadie Rhine, MD 08/17/18 [phone]

## 2018-08-17 LAB — TROPONIN I: Troponin I: <0.03 ng/mL (ref <0.03)

## 2018-08-17 MED ORDER — ALPRAZOLAM 0.5 MG PO TABS: 1.0000 mg | ORAL_TABLET | Freq: Once | ORAL | Status: DC

## 2018-08-17 MED ORDER — ASPIRIN 81 MG PO CHEW: 324.0000 mg | CHEWABLE_TABLET | Freq: Once | ORAL | Status: DC

## 2018-08-17 NOTE — ED Notes (Signed)
Pt left department without notifying staff.  

## 2018-08-17 NOTE — ED Notes (Signed)
Patient left room. Patient went out back door by room 11. RN Casimiro Needle made aware.

## 2018-10-13 ENCOUNTER — Emergency Department (HOSPITAL_COMMUNITY): Payer: Medicare Other

## 2018-10-13 ENCOUNTER — Encounter (HOSPITAL_COMMUNITY): Payer: Self-pay

## 2018-10-13 ENCOUNTER — Emergency Department (HOSPITAL_COMMUNITY)
Admission: EM | Admit: 2018-10-13 | Discharge: 2018-10-13 | Disposition: A | Discharge status: Home / Self Care | Payer: Medicare Other | Attending: Emergency Medicine | Admitting: Emergency Medicine

## 2018-10-13 ENCOUNTER — Other Ambulatory Visit: Payer: Self-pay

## 2018-10-13 DIAGNOSIS — I10 Essential (primary) hypertension: Secondary | ICD-10-CM | POA: Diagnosis not present

## 2018-10-13 DIAGNOSIS — R072 Precordial pain: Secondary | ICD-10-CM

## 2018-10-13 DIAGNOSIS — R0789 Other chest pain: Secondary | ICD-10-CM | POA: Diagnosis present

## 2018-10-13 DIAGNOSIS — E119 Type 2 diabetes mellitus without complications: Secondary | ICD-10-CM | POA: Insufficient documentation

## 2018-10-13 DIAGNOSIS — Z79899 Other long term (current) drug therapy: Secondary | ICD-10-CM | POA: Diagnosis not present

## 2018-10-13 LAB — COMPREHENSIVE METABOLIC PANEL
ALT: 31 U/L (ref 0–44)
AST: 31 U/L (ref 15–41)
Albumin: 3.9 g/dL (ref 3.5–5.0)
Alkaline Phosphatase: 83 U/L (ref 38–126)
Anion gap: 9 (ref 5–15)
BUN: 19 mg/dL (ref 6–20)
CO2: 24 mmol/L (ref 22–32)
Calcium: 9.3 mg/dL (ref 8.9–10.3)
Chloride: 102 mmol/L (ref 98–111)
Creatinine, Ser: 1.08 mg/dL (ref 0.61–1.24)
GFR calc Af Amer: >60 mL/min (ref >60)
GFR calc non Af Amer: >60 mL/min (ref >60)
Glucose, Bld: 289 mg/dL — ABNORMAL HIGH (ref 70–99)
Potassium: 4.1 mmol/L (ref 3.5–5.1)
Sodium: 135 mmol/L (ref 135–145)
Total Bilirubin: 0.5 mg/dL (ref 0.3–1.2)
Total Protein: 7.8 g/dL (ref 6.5–8.1)

## 2018-10-13 LAB — CBC WITH DIFFERENTIAL/PLATELET
Abs Immature Granulocytes: 0.04 10*3/uL (ref 0.00–0.07)
Basophils Absolute: 0 10*3/uL (ref 0.0–0.1)
Basophils Relative: 0 %
Eosinophils Absolute: 0.2 10*3/uL (ref 0.0–0.5)
Eosinophils Relative: 3 %
HCT: 38.2 % — ABNORMAL LOW (ref 39.0–52.0)
Hemoglobin: 12.4 g/dL — ABNORMAL LOW (ref 13.0–17.0)
Immature Granulocytes: 1 %
Lymphocytes Relative: 28 %
Lymphs Abs: 1.7 10*3/uL (ref 0.7–4.0)
MCH: 28.2 pg (ref 26.0–34.0)
MCHC: 32.5 g/dL (ref 30.0–36.0)
MCV: 86.8 fL (ref 80.0–100.0)
Monocytes Absolute: 0.4 10*3/uL (ref 0.1–1.0)
Monocytes Relative: 7 %
Neutro Abs: 3.9 10*3/uL (ref 1.7–7.7)
Neutrophils Relative %: 61 %
Platelets: 193 10*3/uL (ref 150–400)
RBC: 4.4 MIL/uL (ref 4.22–5.81)
RDW: 14.6 % (ref 11.5–15.5)
WBC: 6.3 10*3/uL (ref 4.0–10.5)
nRBC: 0 % (ref 0.0–0.2)

## 2018-10-13 LAB — TROPONIN I: Troponin I: <0.03 ng/mL (ref <0.03)

## 2018-10-13 MED ORDER — ONDANSETRON HCL 4 MG/2ML IJ SOLN
4.0000 mg | Freq: Once | INTRAMUSCULAR | Status: AC
  Administered 2018-10-13: 4 mg via INTRAVENOUS
  Filled 2018-10-13: qty 2

## 2018-10-13 MED ORDER — LORAZEPAM 2 MG/ML IJ SOLN
1.0000 mg | Freq: Once | INTRAMUSCULAR | Status: AC
  Administered 2018-10-13: 1 mg via INTRAVENOUS
  Filled 2018-10-13: qty 1

## 2018-10-13 MED ORDER — SODIUM CHLORIDE 0.9 % IV SOLN
INTRAVENOUS | Status: DC
  Administered 2018-10-13: 20:00:00 via INTRAVENOUS

## 2018-10-13 MED ORDER — ASPIRIN 81 MG PO CHEW
324.0000 mg | CHEWABLE_TABLET | Freq: Once | ORAL | Status: AC
  Administered 2018-10-13: 324 mg via ORAL
  Filled 2018-10-13: qty 4

## 2018-10-13 MED ORDER — MORPHINE SULFATE (PF) 2 MG/ML IV SOLN
2.0000 mg | Freq: Once | INTRAVENOUS | Status: AC
  Administered 2018-10-13: 2 mg via INTRAVENOUS
  Filled 2018-10-13: qty 1

## 2018-10-13 MED ORDER — NITROGLYCERIN 0.4 MG SL SUBL
0.4000 mg | SUBLINGUAL_TABLET | SUBLINGUAL | Status: DC | PRN
  Administered 2018-10-13: 0.4 mg via SUBLINGUAL
  Filled 2018-10-13: qty 1

## 2018-10-13 NOTE — ED Triage Notes (Signed)
Pt has been having left arm and chest pain for 2-3 days and then SOB starting today.  Took 1 ntg pta. Didn't help.   Has happened before

## 2018-10-13 NOTE — ED Provider Notes (Signed)
[facility] EMERGENCY DEPARTMENT Provider Note   CSN: [id number] Arrival date & time: 10/13/18  1912    History   Chief Complaint Chief Complaint  Patient presents with  . Chest Pain    HPI Todd Campbell is a 53 y.o. male.     Patient is from the [place] area.  Patient states that in the fall he had a stent placed at [facility].  Not able to see any records for that.  Complaining of left-sided anterior chest pain that started yesterday radiating to the left arm constant.  Did get a little worse about 6 hours ago.  Patient's oxygen saturations are 97%.  Patient also talking about some myalgias in both lower extremities.  Denies any shortness of breath cough fever or any congestion.  Patient was seen here in February had 2- troponins.  He was discharged back to follow-up with his cardiologist in the [place] area.  Review of that note shows that patient does have a history of anxiety diabetes without complications high cholesterol hypertension and a history of panic attacks.  As stated patient states the chest pain is constant and severe 10 out of 10.     Past Medical History:  Diagnosis Date  . Anxiety   . Diabetes mellitus without complication (HCC)   . High cholesterol   . Hypertension   . Panic attacks     There are no active problems to display for this patient.   Past Surgical History:  Procedure Laterality Date  . EYE SURGERY  1985        Home Medications    Prior to Admission medications   Medication Sig Start Date End Date Taking? Authorizing Provider  ALPRAZolam Prudy Feeler) 1 MG tablet Take 1 mg by mouth 3 (three) times daily.    [provider]  gabapentin (NEURONTIN) 800 MG tablet Take 800 mg by mouth 3 (three) times daily.    [provider]  LEVEMIR FLEXTOUCH 100 UNIT/ML Pen Inject 15 Units into the skin 2 (two) times daily.  04/03/18   [provider]  lisinopril (PRINIVIL,ZESTRIL) 5 MG tablet Take 10 mg by mouth daily.  05/11/18   [provider]  nitroGLYCERIN (NITROSTAT) 0.4 MG SL tablet Place 0.4 mg under the tongue every 5 (five) minutes as needed for chest pain.  04/14/18   [provider]  NOVOLOG MIX 70/30 FLEXPEN (70-30) 100 UNIT/ML FlexPen Inject 10 Units into the skin 3 (three) times daily after meals.  03/20/18   [provider]  omeprazole (PRILOSEC) 20 MG capsule Take 1 po BID x 2 weeks then once a day Patient taking differently: Take 20 mg by mouth 2 (two) times daily before a meal.  05/16/18   Devoria Albe, MD  rosuvastatin (CRESTOR) 40 MG tablet Take 40 mg by mouth every evening.    [provider]    Family History Family History  Problem Relation Age of Onset  . Diabetes Mother   . Heart attack Mother   . Diabetes Father   . Heart attack Father     Social History Social History   Tobacco Use  . Smoking status: Never Smoker  . Smokeless tobacco: Current User    Types: Chew  Substance Use Topics  . Alcohol use: Never    Frequency: Never  . Drug use: Never     Allergies   Peanut-containing drug products   Review of Systems Review of Systems  Constitutional: Negative for chills and fever.  HENT: Negative  for rhinorrhea and sore throat.   Eyes: Negative for visual disturbance.  Respiratory: Negative for cough and shortness of breath.   Cardiovascular: Positive for chest pain. Negative for leg swelling.  Gastrointestinal: Negative for abdominal pain, diarrhea, nausea and vomiting.  Genitourinary: Negative for dysuria.  Musculoskeletal: Positive for myalgias. Negative for back pain and neck pain.  Skin: Negative for rash.  Neurological: Negative for dizziness, light-headedness and headaches.  Hematological: Does not bruise/bleed easily.  Psychiatric/Behavioral: Negative for confusion.     Physical Exam Updated Vital Signs BP 132/86   Pulse 86   Temp 97.7 F (36.5 C) (Oral)   Resp 16   Ht 1.702 m (5\' 7" )   Wt 88.5 kg   SpO2 94%    BMI 30.54 kg/m   Physical Exam Vitals signs and nursing note reviewed.  Constitutional:      Appearance: He is well-developed.  HENT:     Head: Normocephalic and atraumatic.  Eyes:     Extraocular Movements: Extraocular movements intact.     Conjunctiva/sclera: Conjunctivae normal.     Pupils: Pupils are equal, round, and reactive to light.  Neck:     Musculoskeletal: Normal range of motion and neck supple.  Cardiovascular:     Rate and Rhythm: Normal rate and regular rhythm.     Heart sounds: No murmur.  Pulmonary:     Effort: Pulmonary effort is normal. No respiratory distress.     Breath sounds: Normal breath sounds.  Abdominal:     Palpations: Abdomen is soft.     Tenderness: There is no abdominal tenderness.  Musculoskeletal:        General: Tenderness present. No swelling or deformity.     Comments: Tenderness to palpation to the left forearm area no evidence of any injury or trauma.  Radial pulses 2+.  Skin:    General: Skin is warm and dry.     Capillary Refill: Capillary refill takes less than 2 seconds.  Neurological:     General: No focal deficit present.     Mental Status: He is alert and oriented to person, place, and time.     Cranial Nerves: No cranial nerve deficit.     Sensory: No sensory deficit.     Motor: No weakness.      ED Treatments / Results  Labs (all labs ordered are listed, but only abnormal results are displayed) Labs Reviewed  CBC WITH DIFFERENTIAL/PLATELET - Abnormal; Notable for the following components:      Result Value   Hemoglobin 12.4 (*)    HCT 38.2 (*)    All other components within normal limits  COMPREHENSIVE METABOLIC PANEL - Abnormal; Notable for the following components:   Glucose, Bld 289 (*)    All other components within normal limits  TROPONIN I    EKG EKG Interpretation  Date/Time:  Friday October 13 2018 19:23:38 EDT Ventricular Rate:  88 PR Interval:    QRS Duration: 104 QT Interval:  361 QTC Calculation:  437 R Axis:   8 Text Interpretation:  Sinus rhythm Consider anterior infarct Baseline wander in lead(s) V3 No significant change since last tracing Confirmed by Vanetta MuldersZackowski, Suheily Birks [id number]) on 10/13/2018 7:41:54 PM   Radiology Dg Chest Port 1 View  Result Date: 10/13/2018 CLINICAL DATA:  Chest pain. EXAM: PORTABLE CHEST 1 VIEW COMPARISON:  Radiographs of August 16, 2018. FINDINGS: The heart size and mediastinal contours are within normal limits. Both lungs are clear. No pneumothorax or pleural effusion is noted. The  visualized skeletal structures are unremarkable. IMPRESSION: No active disease. Electronically Signed   By: Lupita Raider, M.D.   On: 10/13/2018 19:40    Procedures Procedures (including critical care time)  Medications Ordered in ED Medications  0.9 %  sodium chloride infusion ( Intravenous New Bag/Given 10/13/18 2019)  nitroGLYCERIN (NITROSTAT) SL tablet 0.4 mg (0.4 mg Sublingual Given 10/13/18 2017)  aspirin chewable tablet 324 mg (324 mg Oral Given 10/13/18 2011)  morphine 2 MG/ML injection 2 mg (2 mg Intravenous Given 10/13/18 2011)  ondansetron (ZOFRAN) injection 4 mg (4 mg Intravenous Given 10/13/18 2009)  LORazepam (ATIVAN) injection 1 mg (1 mg Intravenous Given 10/13/18 2012)     Initial Impression / Assessment and Plan / ED Course  I have reviewed the triage vital signs and the nursing notes.  Pertinent labs & imaging results that were available during my care of the patient were reviewed by me and considered in my medical decision making (see chart for details).        Patient's had constant chest pain since yesterday.  Initial troponin was negative.  Patient states that about 6 hours ago the pain did get a little bit worse.  I think the single negative troponin is very comforting.  Chest x-ray without any acute findings.  Labs without any significant abnormalities.  Other than elevated blood sugar.  See how he is doing EKG without any acute changes compared to his  EKG from February.  Patient treated here with nitroglycerin aspirin some morphine and some Ativan.   Patient more comfortable pain significantly improved is not completely resolved.  But due to the duration of the pain I think it is safe to say that this not an acute cardiac event.  Will discharge patient home and have him follow-up with his cardiologist in [place].  Recommend he restart taking a baby aspirin a day.  Return for any new or worse symptoms.  Final Clinical Impressions(s) / ED Diagnoses   Final diagnoses:  None    ED Discharge Orders    None       Vanetta Mulders, MD 10/13/18 2056

## 2018-10-13 NOTE — ED Notes (Signed)
Xray bedside.

## 2018-10-13 NOTE — Discharge Instructions (Addendum)
Resume taking a baby aspirin a day.  Today's work-up without any acute findings.  Return for any new or worse symptoms.  Recommend making follow-up appointment with your cardiologist for next week.

## 2018-10-17 ENCOUNTER — Encounter (HOSPITAL_COMMUNITY): Payer: Self-pay | Admitting: Emergency Medicine

## 2018-10-17 ENCOUNTER — Emergency Department (HOSPITAL_COMMUNITY)
Admission: EM | Admit: 2018-10-17 | Discharge: 2018-10-17 | Disposition: A | Discharge status: Home / Self Care | Payer: Medicare Other | Attending: Emergency Medicine | Admitting: Emergency Medicine

## 2018-10-17 ENCOUNTER — Other Ambulatory Visit: Payer: Self-pay

## 2018-10-17 ENCOUNTER — Emergency Department (HOSPITAL_COMMUNITY): Payer: Medicare Other

## 2018-10-17 DIAGNOSIS — I10 Essential (primary) hypertension: Secondary | ICD-10-CM | POA: Insufficient documentation

## 2018-10-17 DIAGNOSIS — Z79899 Other long term (current) drug therapy: Secondary | ICD-10-CM | POA: Diagnosis not present

## 2018-10-17 DIAGNOSIS — M791 Myalgia, unspecified site: Secondary | ICD-10-CM | POA: Diagnosis present

## 2018-10-17 DIAGNOSIS — E119 Type 2 diabetes mellitus without complications: Secondary | ICD-10-CM | POA: Diagnosis not present

## 2018-10-17 DIAGNOSIS — R52 Pain, unspecified: Secondary | ICD-10-CM

## 2018-10-17 NOTE — ED Notes (Signed)
Pt aware of needing to be quarantined

## 2018-10-17 NOTE — ED Triage Notes (Signed)
Patient complaining of generalized body aches x 5 days.

## 2018-10-17 NOTE — ED Notes (Signed)
CXR at bedside

## 2018-10-17 NOTE — ED Provider Notes (Signed)
[facility] EMERGENCY DEPARTMENT Provider Note   CSN: [id number] Arrival date & time: 10/17/18  1831    History   Chief Complaint Chief Complaint  Patient presents with  . Generalized Body Aches    HPI Todd Campbell is a 53 y.o. male.     The history is provided by the patient. No language interpreter was used.  Muscle Pain  This is a recurrent problem. Episode onset: 5 days. The problem occurs constantly. The problem has not changed since onset.Associated symptoms include headaches. Nothing aggravates the symptoms. Nothing relieves the symptoms. The treatment provided no relief.  Pt was seen at [facility] yesterday.  He is currently on zithromax.  Pt has a pending covid 19 test.  (pt states he did not notify staff because he didn't remember)   Past Medical History:  Diagnosis Date  . Anxiety   . Diabetes mellitus without complication (HCC)   . High cholesterol   . Hypertension   . Panic attacks     There are no active problems to display for this patient.   Past Surgical History:  Procedure Laterality Date  . EYE SURGERY  1985        Home Medications    Prior to Admission medications   Medication Sig Start Date End Date Taking? Authorizing Provider  ALPRAZolam Prudy Feeler(XANAX) 1 MG tablet Take 1 mg by mouth 3 (three) times daily.    [provider]  gabapentin (NEURONTIN) 800 MG tablet Take 800 mg by mouth 3 (three) times daily.    [provider]  LEVEMIR FLEXTOUCH 100 UNIT/ML Pen Inject 15 Units into the skin 2 (two) times daily.  04/03/18   [provider]  lisinopril (PRINIVIL,ZESTRIL) 5 MG tablet Take 10 mg by mouth daily. 05/11/18   [provider]  nitroGLYCERIN (NITROSTAT) 0.4 MG SL tablet Place 0.4 mg under the tongue every 5 (five) minutes as needed for chest pain.  04/14/18   [provider]  NOVOLOG MIX 70/30 FLEXPEN (70-30) 100 UNIT/ML FlexPen Inject 10 Units into the skin 3 (three) times daily after  meals.  03/20/18   [provider]  omeprazole (PRILOSEC) 20 MG capsule Take 1 po BID x 2 weeks then once a day Patient taking differently: Take 20 mg by mouth 2 (two) times daily before a meal.  05/16/18   Devoria AlbeKnapp, Iva, MD  rosuvastatin (CRESTOR) 40 MG tablet Take 40 mg by mouth every evening.    [provider]    Family History Family History  Problem Relation Age of Onset  . Diabetes Mother   . Heart attack Mother   . Diabetes Father   . Heart attack Father     Social History Social History   Tobacco Use  . Smoking status: Never Smoker  . Smokeless tobacco: Current User    Types: Chew  Substance Use Topics  . Alcohol use: Never    Frequency: Never  . Drug use: Never     Allergies   Peanut-containing drug products   Review of Systems Review of Systems  Respiratory: Positive for cough.   Musculoskeletal: Positive for myalgias.  Neurological: Positive for headaches.  All other systems reviewed and are negative.    Physical Exam Updated Vital Signs BP 135/85   Pulse 100   Temp 98.3 F (36.8 C) (Oral)   Resp 18   Ht 5\' 7"  (1.702 m)   Wt 91.2 kg   SpO2 96%   BMI 31.48 kg/m   Physical  Exam Vitals signs and nursing note reviewed.  Constitutional:      Appearance: He is well-developed.  HENT:     Head: Normocephalic.     Mouth/Throat:     Mouth: Mucous membranes are moist.  Eyes:     Pupils: Pupils are equal, round, and reactive to light.  Neck:     Musculoskeletal: Normal range of motion.  Cardiovascular:     Rate and Rhythm: Normal rate.  Pulmonary:     Effort: Pulmonary effort is normal. No respiratory distress.     Breath sounds: No wheezing or rhonchi.  Abdominal:     General: There is no distension.  Musculoskeletal: Normal range of motion.  Neurological:     Mental Status: He is alert and oriented to person, place, and time.      ED Treatments / Results  Labs (all labs ordered are listed, but only abnormal results are  displayed) Labs Reviewed - No data to display  EKG None  Radiology Dg Chest [facility] 1 View  Result Date: 10/17/2018 CLINICAL DATA:  Cough EXAM: PORTABLE CHEST 1 VIEW COMPARISON:  10/13/2018 FINDINGS: The heart size and mediastinal contours are within normal limits. Both lungs are clear. The visualized skeletal structures are unremarkable. IMPRESSION: No active disease. Electronically Signed   By: Deatra Robinson M.D.   On: 10/17/2018 19:43    Procedures Procedures (including critical care time)  Medications Ordered in ED Medications - No data to display   Initial Impression / Assessment and Plan / ED Course  I have reviewed the triage vital signs and the nursing notes.  Pertinent labs & imaging results that were available during my care of the patient were reviewed by me and considered in my medical decision making (see chart for details).        MDM  Chest xray no pneumonia.  Vitals normal, no fever.  Pt advised to discuss pain management with his MD.  I will provide information on Covid.  He appears to have been given this  yesterday.  Pt advised to return if symptoms worsen or change.  He is advised he needs to let medical staff know pending covid/covid results.  PMP overdose score 730.   Final Clinical Impressions(s) / ED Diagnoses   Final diagnoses:  Body aches    ED Discharge Orders    None    An After Visit Summary was printed and given to the patient.    Osie Cheeks 10/17/18 Hardie Shackleton, MD 10/18/18 [id number]

## 2018-10-17 NOTE — ED Notes (Addendum)
* * *  Pt has pending covid test with [facility] * * * Pt did not inform staff that he has pending test.  edp aware of the same

## 2018-10-17 NOTE — ED Notes (Signed)
Patient states he is having generalized pain from falling last week. States he was seen for this problem Friday but he does not feel that ER staff did enough for him to feel better.

## 2018-10-17 NOTE — Discharge Instructions (Addendum)
Discuss your pain management with your Physician.   Finish taking antibiotic. Follow up with your provider for results of Covid testing    Person Under Monitoring Name: Todd Campbell  Location: [address] [place] [address]   Infection Prevention Recommendations for Individuals Confirmed to have, or Being Evaluated for, 2019 Novel Coronavirus (COVID-19) Infection Who Receive Care at Home  Individuals who are confirmed to have, or are being evaluated for, COVID-19 should follow the prevention steps below until a healthcare provider or local or state health department says they can return to normal activities.  Stay home except to get medical care You should restrict activities outside your home, except for getting medical care. Do not go to work, school, or public areas, and do not use public transportation or taxis.  Call ahead before visiting your doctor Before your medical appointment, call the healthcare provider and tell them that you have, or are being evaluated for, COVID-19 infection. This will help the healthcare providers office take steps to keep other people from getting infected. Ask your healthcare provider to call the local or state health department.  Monitor your symptoms Seek prompt medical attention if your illness is worsening (e.g., difficulty breathing). Before going to your medical appointment, call the healthcare provider and tell them that you have, or are being evaluated for, COVID-19 infection. Ask your healthcare provider to call the local or state health department.  Wear a facemask You should wear a facemask that covers your nose and mouth when you are in the same room with other people and when you visit a healthcare provider. People who live with or visit you should also wear a facemask while they are in the same room with you.  Separate yourself from other people in your home As much as possible, you should stay in a different room  from other people in your home. Also, you should use a separate bathroom, if available.  Avoid sharing household items You should not share dishes, drinking glasses, cups, eating utensils, towels, bedding, or other items with other people in your home. After using these items, you should wash them thoroughly with soap and water.  Cover your coughs and sneezes Cover your mouth and nose with a tissue when you cough or sneeze, or you can cough or sneeze into your sleeve. Throw used tissues in a lined trash can, and immediately wash your hands with soap and water for at least 20 seconds or use an alcohol-based hand rub.  Wash your [employer] your hands often and thoroughly with soap and water for at least 20 seconds. You can use an alcohol-based hand sanitizer if soap and water are not available and if your hands are not visibly dirty. Avoid touching your eyes, nose, and mouth with unwashed hands.   Prevention Steps for Caregivers and Household Members of Individuals Confirmed to have, or Being Evaluated for, COVID-19 Infection Being Cared for in the Home  If you live with, or provide care at home for, a person confirmed to have, or being evaluated for, COVID-19 infection please follow these guidelines to prevent infection:  Follow healthcare providers instructions Make sure that you understand and can help the patient follow any healthcare provider instructions for all care.  Provide for the patients basic needs You should help the patient with basic needs in the home and provide support for getting groceries, prescriptions, and other personal needs.  Monitor the patients symptoms If they are getting sicker, call his or her medical  provider and tell them that the patient has, or is being evaluated for, COVID-19 infection. This will help the healthcare providers office take steps to keep other people from getting infected. Ask the healthcare provider to call the local or state  health department.  Limit the number of people who have contact with the patient If possible, have only one caregiver for the patient. Other household members should stay in another home or place of residence. If this is not possible, they should stay in another room, or be separated from the patient as much as possible. Use a separate bathroom, if available. Restrict visitors who do not have an essential need to be in the home.  Keep older adults, very young children, and other sick people away from the patient Keep older adults, very young children, and those who have compromised immune systems or chronic health conditions away from the patient. This includes people with chronic heart, lung, or kidney conditions, diabetes, and cancer.  Ensure good ventilation Make sure that shared spaces in the home have good air flow, such as from an air conditioner or an opened window, weather permitting.  Wash your hands often Wash your hands often and thoroughly with soap and water for at least 20 seconds. You can use an alcohol based hand sanitizer if soap and water are not available and if your hands are not visibly dirty. Avoid touching your eyes, nose, and mouth with unwashed hands. Use disposable paper towels to dry your hands. If not available, use dedicated cloth towels and replace them when they become wet.  Wear a facemask and gloves Wear a disposable facemask at all times in the room and gloves when you touch or have contact with the patients blood, body fluids, and/or secretions or excretions, such as sweat, saliva, sputum, nasal mucus, vomit, urine, or feces.  Ensure the mask fits over your nose and mouth tightly, and do not touch it during use. Throw out disposable facemasks and gloves after using them. Do not reuse. Wash your hands immediately after removing your facemask and gloves. If your personal clothing becomes contaminated, carefully remove clothing and launder. Wash your hands  after handling contaminated clothing. Place all used disposable facemasks, gloves, and other waste in a lined container before disposing them with other household waste. Remove gloves and wash your hands immediately after handling these items.  Do not share dishes, glasses, or other household items with the patient Avoid sharing household items. You should not share dishes, drinking glasses, cups, eating utensils, towels, bedding, or other items with a patient who is confirmed to have, or being evaluated for, COVID-19 infection. After the person uses these items, you should wash them thoroughly with soap and water.  Wash laundry thoroughly Immediately remove and wash clothes or bedding that have blood, body fluids, and/or secretions or excretions, such as sweat, saliva, sputum, nasal mucus, vomit, urine, or feces, on them. Wear gloves when handling laundry from the patient. Read and follow directions on labels of laundry or clothing items and detergent. In general, wash and dry with the warmest temperatures recommended on the label.  Clean all areas the individual has used often Clean all touchable surfaces, such as counters, tabletops, doorknobs, bathroom fixtures, toilets, phones, keyboards, tablets, and bedside tables, every day. Also, clean any surfaces that may have blood, body fluids, and/or secretions or excretions on them. Wear gloves when cleaning surfaces the patient has come in contact with. Use a diluted bleach solution (e.g., dilute bleach with 1  part bleach and 10 parts water) or a household disinfectant with a label that says EPA-registered for coronaviruses. To make a bleach solution at home, add 1 tablespoon of bleach to 1 quart (4 cups) of water. For a larger supply, add  cup of bleach to 1 gallon (16 cups) of water. Read labels of cleaning products and follow recommendations provided on product labels. Labels contain instructions for safe and effective use of the cleaning product  including precautions you should take when applying the product, such as wearing gloves or eye protection and making sure you have good ventilation during use of the product. Remove gloves and wash hands immediately after cleaning.  Monitor yourself for signs and symptoms of illness Caregivers and household members are considered close contacts, should monitor their health, and will be asked to limit movement outside of the home to the extent possible. Follow the monitoring steps for close contacts listed on the symptom monitoring form.   ? If you have additional questions, contact your local health department or call the epidemiologist on call at [phone] (available 24/7). ? This guidance is subject to change. For the most up-to-date guidance from [facility], please refer to their website: TripMetro.huhttps://www.cdc.gov/coronavirus/2019-ncov/hcp/guidance-prevent-spread.html

## 2019-01-17 IMAGING — CT CT ABD-PELV W/ CM
2 of 4 series · 16 of 46 positions shown, 18 images · IV contrast (Isovue)
Comparison: None.

CLINICAL DATA: Acute onset of generalized abdominal pain.
Hematemesis.

EXAM:
CT ABDOMEN AND PELVIS WITH CONTRAST
TECHNIQUE: Multidetector CT imaging of the abdomen and pelvis was performed
using the standard protocol following bolus administration of
intravenous contrast.
CONTRAST:  100mL [id number] IOPAMIDOL ([id number]) INJECTION 61%

[Series 3: axial st · axial · 0.98mm/px · z∈[+1613,+2048]mm · 13 of 97 slices shown, 15 images]
[im 5/97  soft-tissue]
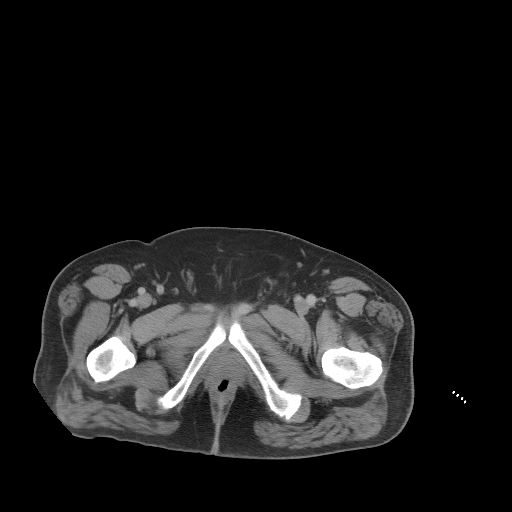
[im 5/97  bone]
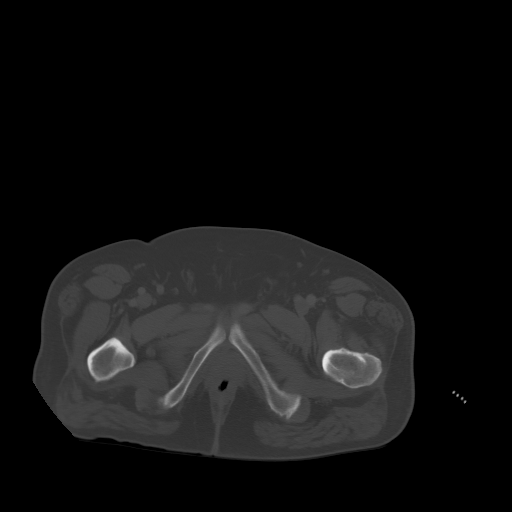
[im 13/97  soft-tissue]
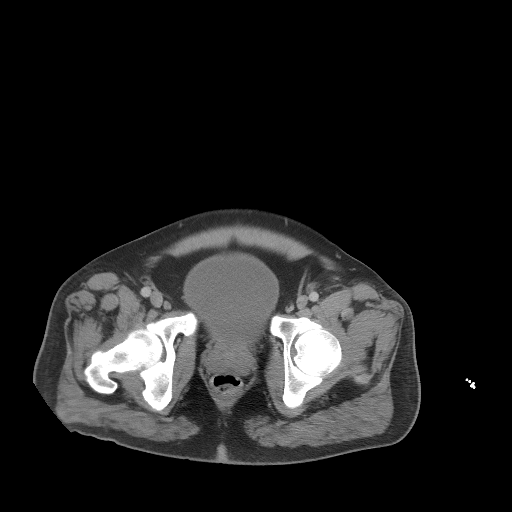
[im 21/97  soft-tissue]
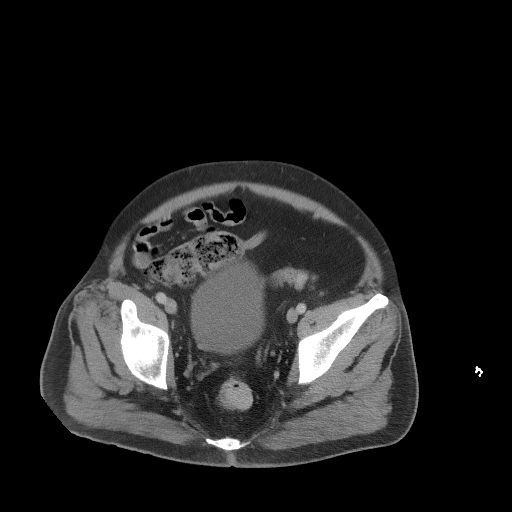
[im 26/97  soft-tissue]
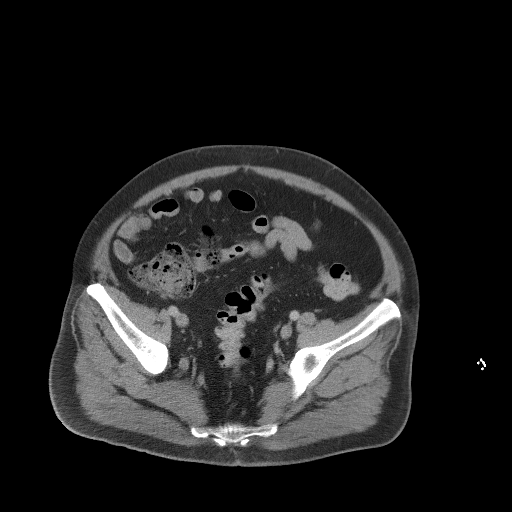
[im 34/97  soft-tissue]
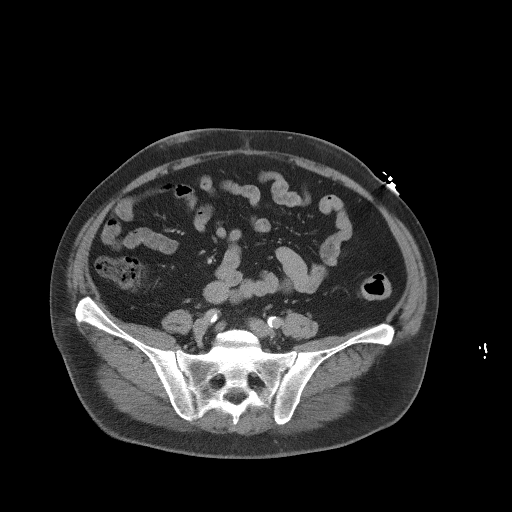
[im 42/97  soft-tissue]
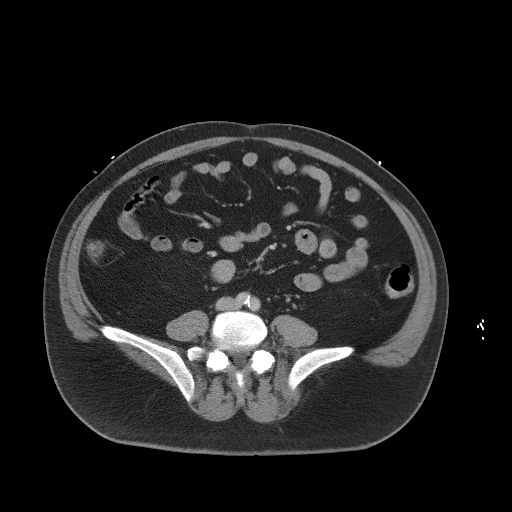
[im 51/97  soft-tissue]
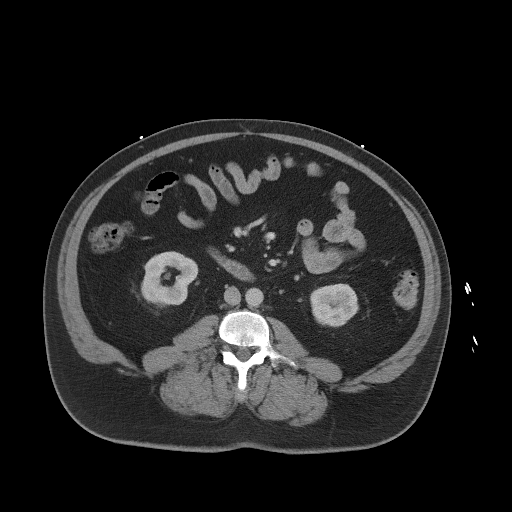
[im 55/97  soft-tissue]
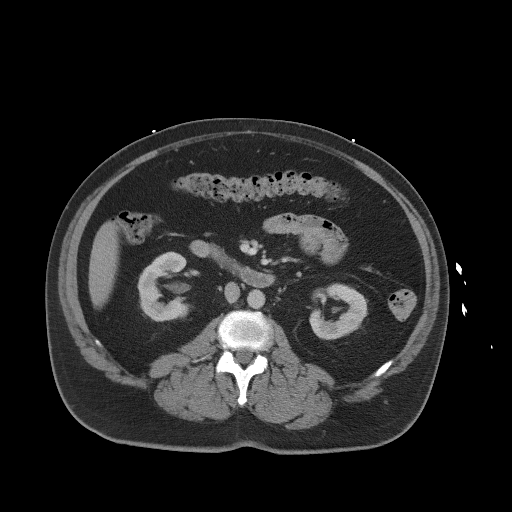
[im 63/97  soft-tissue]
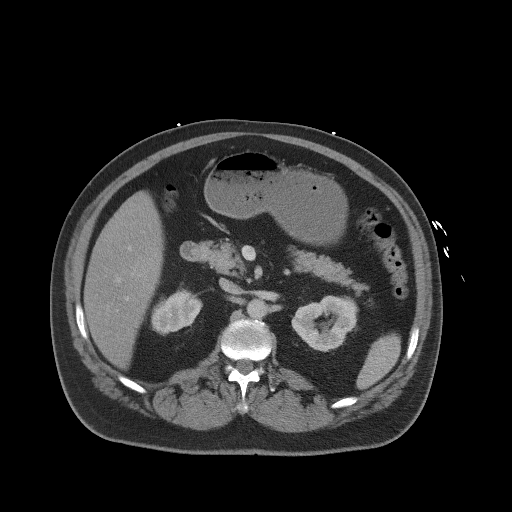
[im 63/97  bone]
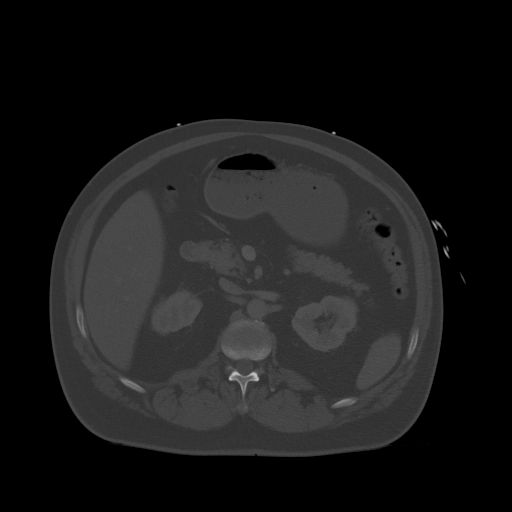
[im 71/97  soft-tissue]
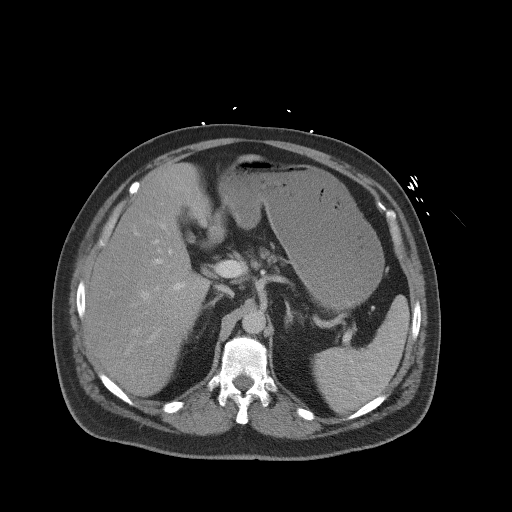
[im 76/97  soft-tissue]
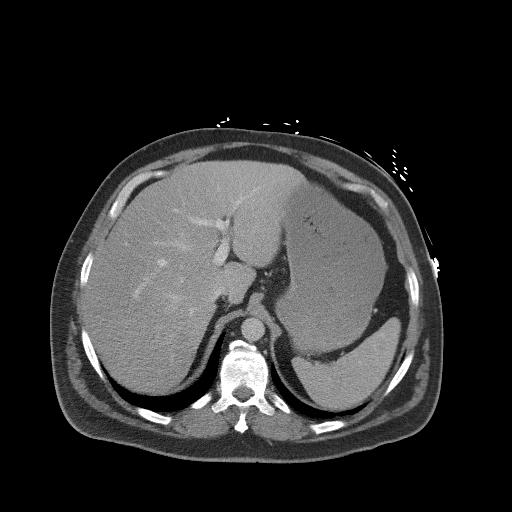
[im 84/97  soft-tissue]
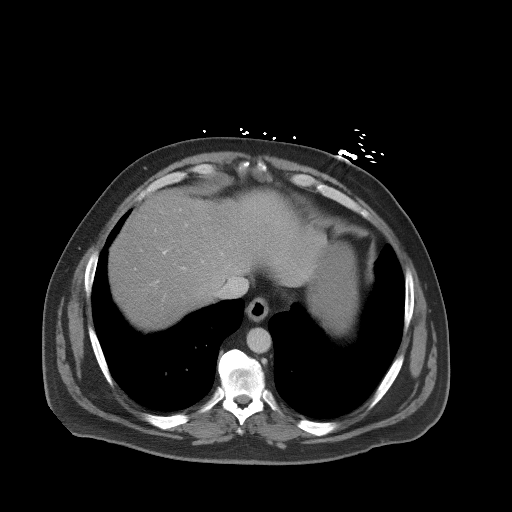
[im 92/97  soft-tissue]
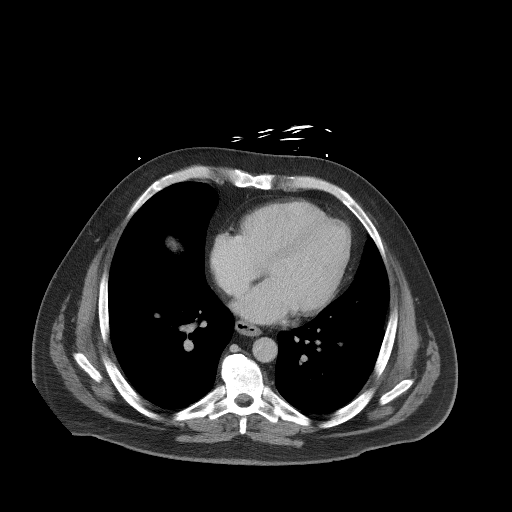

[Series 6: coronal st · coronal · 0.88mm/px · 3 of 113 slices shown]
[im 38/113  soft-tissue]
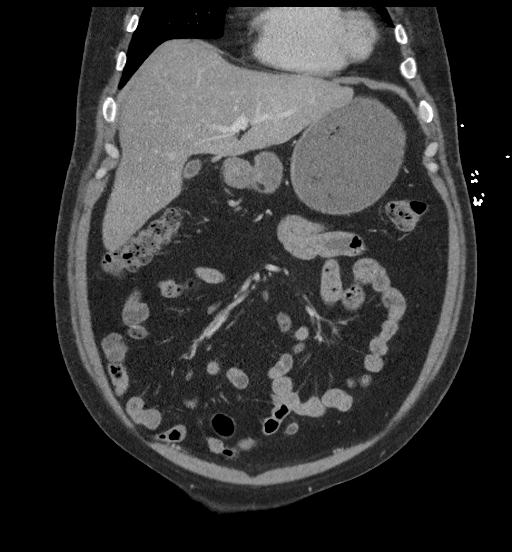
[im 50/113  soft-tissue]
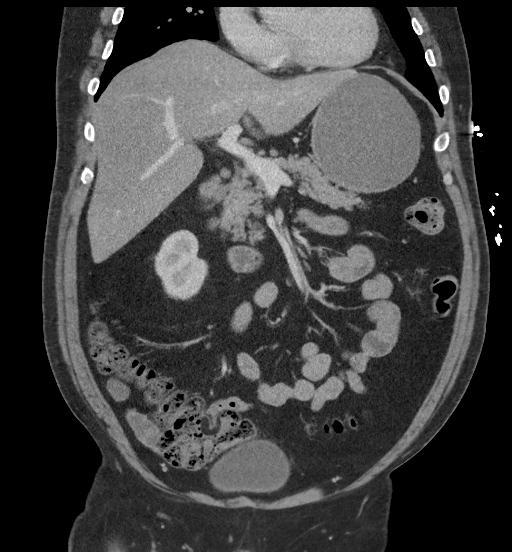
[im 63/113  soft-tissue]
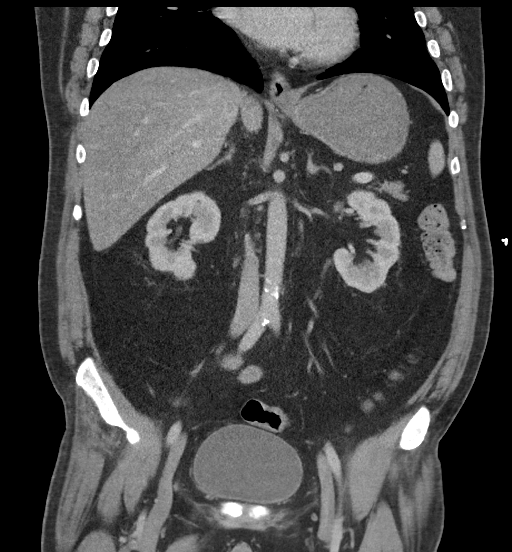

[16 of 46 positions shown; findings below may reference images not displayed]

FINDINGS: Lower chest: The visualized lung bases are grossly clear. The
visualized portions of the mediastinum are unremarkable.

Hepatobiliary: The liver is unremarkable in appearance. The
gallbladder is unremarkable in appearance. The common bile duct
remains normal in caliber.

Pancreas: The pancreas is within normal limits.

Spleen: The spleen is unremarkable in appearance.

Adrenals/Urinary Tract: The adrenal glands are unremarkable in
appearance.

Nonspecific perinephric stranding is noted bilaterally. There is no
evidence of hydronephrosis. No renal or ureteral stones are
identified.

Stomach/Bowel: The appendix is dilated to 9 mm in diameter, but no
soft tissue inflammation is seen. This is thought to reflect the
patient's baseline. The colon is unremarkable. The small bowel is
largely decompressed and grossly unremarkable in appearance. The
stomach is within normal limits.

Vascular/Lymphatic: Scattered calcification is seen along the
abdominal aorta and its branches. The abdominal aorta is otherwise
grossly unremarkable. The inferior vena cava is grossly
unremarkable. No retroperitoneal lymphadenopathy is seen. No pelvic
sidewall lymphadenopathy is identified.

Reproductive: The bladder is moderately distended and grossly
unremarkable. The prostate is normal in size.

Other: No additional soft tissue abnormalities are seen.

Musculoskeletal: No acute osseous abnormalities are identified. The
visualized musculature is unremarkable in appearance.
IMPRESSION: No acute abnormality seen within the abdomen or pelvis. Appendix is
prominent but otherwise unremarkable in appearance, without definite
evidence for appendicitis.

Aortic Atherosclerosis ([id number]).

## 2020-04-20 ENCOUNTER — Other Ambulatory Visit: Payer: Self-pay

## 2020-04-20 ENCOUNTER — Emergency Department (HOSPITAL_COMMUNITY): Payer: Medicare Other

## 2020-04-20 ENCOUNTER — Encounter (HOSPITAL_COMMUNITY): Payer: Self-pay | Admitting: Emergency Medicine

## 2020-04-20 ENCOUNTER — Emergency Department (HOSPITAL_COMMUNITY)
Admission: EM | Admit: 2020-04-20 | Discharge: 2020-04-20 | Disposition: A | Discharge status: Home / Self Care | Payer: Medicare Other | Attending: Emergency Medicine | Admitting: Emergency Medicine

## 2020-04-20 DIAGNOSIS — I10 Essential (primary) hypertension: Secondary | ICD-10-CM | POA: Insufficient documentation

## 2020-04-20 DIAGNOSIS — Z7982 Long term (current) use of aspirin: Secondary | ICD-10-CM | POA: Insufficient documentation

## 2020-04-20 DIAGNOSIS — R202 Paresthesia of skin: Secondary | ICD-10-CM | POA: Insufficient documentation

## 2020-04-20 DIAGNOSIS — R4781 Slurred speech: Secondary | ICD-10-CM | POA: Diagnosis not present

## 2020-04-20 DIAGNOSIS — Z79899 Other long term (current) drug therapy: Secondary | ICD-10-CM | POA: Diagnosis not present

## 2020-04-20 DIAGNOSIS — Z91010 Allergy to peanuts: Secondary | ICD-10-CM | POA: Diagnosis not present

## 2020-04-20 DIAGNOSIS — R29810 Facial weakness: Secondary | ICD-10-CM | POA: Diagnosis not present

## 2020-04-20 DIAGNOSIS — Z794 Long term (current) use of insulin: Secondary | ICD-10-CM | POA: Insufficient documentation

## 2020-04-20 DIAGNOSIS — E119 Type 2 diabetes mellitus without complications: Secondary | ICD-10-CM | POA: Diagnosis not present

## 2020-04-20 HISTORY — DX: Transient cerebral ischemic attack, unspecified: G45.9

## 2020-04-20 LAB — CBC WITH DIFFERENTIAL/PLATELET
Abs Immature Granulocytes: 0.03 10*3/uL (ref 0.00–0.07)
Basophils Absolute: 0 10*3/uL (ref 0.0–0.1)
Basophils Relative: 0 %
Eosinophils Absolute: 0.1 10*3/uL (ref 0.0–0.5)
Eosinophils Relative: 2 %
HCT: 40.8 % (ref 39.0–52.0)
Hemoglobin: 13.8 g/dL (ref 13.0–17.0)
Immature Granulocytes: 1 %
Lymphocytes Relative: 15 %
Lymphs Abs: 1 10*3/uL (ref 0.7–4.0)
MCH: 29.9 pg (ref 26.0–34.0)
MCHC: 33.8 g/dL (ref 30.0–36.0)
MCV: 88.5 fL (ref 80.0–100.0)
Monocytes Absolute: 0.5 10*3/uL (ref 0.1–1.0)
Monocytes Relative: 8 %
Neutro Abs: 4.8 10*3/uL (ref 1.7–7.7)
Neutrophils Relative %: 74 %
Platelets: 150 10*3/uL (ref 150–400)
RBC: 4.61 MIL/uL (ref 4.22–5.81)
RDW: 13.1 % (ref 11.5–15.5)
WBC: 6.5 10*3/uL (ref 4.0–10.5)
nRBC: 0 % (ref 0.0–0.2)

## 2020-04-20 LAB — BASIC METABOLIC PANEL
Anion gap: 9 (ref 5–15)
BUN: 12 mg/dL (ref 6–20)
CO2: 25 mmol/L (ref 22–32)
Calcium: 8.7 mg/dL — ABNORMAL LOW (ref 8.9–10.3)
Chloride: 99 mmol/L (ref 98–111)
Creatinine, Ser: 1.22 mg/dL (ref 0.61–1.24)
GFR, Estimated: >60 mL/min (ref >60)
Glucose, Bld: 581 mg/dL (ref 70–99)
Potassium: 3.8 mmol/L (ref 3.5–5.1)
Sodium: 133 mmol/L — ABNORMAL LOW (ref 135–145)

## 2020-04-20 LAB — CBG MONITORING, ED
Glucose-Capillary: 476 mg/dL — ABNORMAL HIGH (ref 70–99)
Glucose-Capillary: 375 mg/dL — ABNORMAL HIGH (ref 70–99)

## 2020-04-20 MED ORDER — SODIUM CHLORIDE 0.9 % IV BOLUS
1000.0000 mL | Freq: Once | INTRAVENOUS | Status: AC
  Administered 2020-04-20: 1000 mL via INTRAVENOUS

## 2020-04-20 NOTE — ED Triage Notes (Signed)
Patient c/o left side numbness to face, arm, and leg that started yesterday at 12pm and is progressively getting. Per patient slurred speech, difficulty to swallowing, intermittent headaches, and fascial drooping. 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

## 2020-04-20 NOTE — ED Notes (Signed)
Discussed w pt that he should forego soft drinks   The dangers of high blood sugar  Neuropathy, heart disease, vascular problems, delayed healing

## 2020-04-20 NOTE — ED Provider Notes (Signed)
[facility] EMERGENCY DEPARTMENT Provider Note  CSN: [id number] Arrival date & time: 04/20/20 5784    History Chief Complaint  Patient presents with  . Extremity Weakness    HPI  Todd Campbell is a 54 y.o. male with history fo HTN, DM and HLD initially reported onset of L arm and leg numbness he described as a tingling yesterday afternoon, associated with facial droop and slurred speech. He did not initially report any weakness. He says his symptoms started at around noon yesterday but after noting that he was seen at the ED in [facility] for the same thing on 10/12, where his workup including CT and MRI were neg, he admits his symptoms have been ongoing for about a week. He was also admitted for uncontrolled DM with sugar >900 and an AKI both of which had improved by discharge on 10/13.    Past Medical History:  Diagnosis Date  . Anxiety   . Diabetes mellitus without complication (HCC)   . High cholesterol   . Hypertension   . Panic attacks   . TIA (transient ischemic attack)     Past Surgical History:  Procedure Laterality Date  . APPENDECTOMY    . EYE SURGERY  1985    Family History  Problem Relation Age of Onset  . Diabetes Mother   . Heart attack Mother   . Diabetes Father   . Heart attack Father     Social History   Tobacco Use  . Smoking status: Never Smoker  . Smokeless tobacco: Current User    Types: Chew  Vaping Use  . Vaping Use: Never used  Substance Use Topics  . Alcohol use: Never  . Drug use: Never     Home Medications Prior to Admission medications   Medication Sig Start Date End Date Taking? Authorizing Provider  ALPRAZolam Prudy Feeler) 1 MG tablet Take 1 mg by mouth 3 (three) times daily.   Yes [provider]  gabapentin (NEURONTIN) 800 MG tablet Take 800 mg by mouth 3 (three) times daily.   Yes [provider]  LEVEMIR FLEXTOUCH 100 UNIT/ML Pen Inject 40 Units into the skin daily.  04/03/18  Yes [provider]    lisinopril (PRINIVIL,ZESTRIL) 5 MG tablet Take 10 mg by mouth daily. 05/11/18  Yes [provider]  NOVOLOG MIX 70/30 FLEXPEN (70-30) 100 UNIT/ML FlexPen Inject 10 Units into the skin 3 (three) times daily after meals.  03/20/18  Yes [provider]  omeprazole (PRILOSEC) 20 MG capsule Take 1 po BID x 2 weeks then once a day Patient taking differently: Take 20 mg by mouth 2 (two) times daily before a meal.  05/16/18  Yes Devoria Albe, MD  rosuvastatin (CRESTOR) 40 MG tablet Take 40 mg by mouth every evening.   Yes [provider]  aspirin 81 MG EC tablet Take 81 mg by mouth daily.    [provider]  clonazePAM (KLONOPIN) 0.5 MG tablet Take 0.5 mg by mouth daily as needed for anxiety. 04/14/20   [provider]  nitroGLYCERIN (NITROSTAT) 0.4 MG SL tablet Place 0.4 mg under the tongue every 5 (five) minutes as needed for chest pain.  04/14/18   [provider]  pantoprazole (PROTONIX) 20 MG tablet Take 20 mg by mouth daily. 04/16/20   [provider]     Allergies    Peanut-containing drug products   Review of Systems   Review of Systems A comprehensive review of systems was completed and negative except  as noted in HPI.    Physical Exam BP (!) 144/91   Pulse 74   Resp (!) 28   Ht 5\' 7"  (1.702 m)   Wt 88.5 kg   SpO2 94%   BMI 30.54 kg/m   Physical Exam Vitals and nursing note reviewed.  Constitutional:      Appearance: Normal appearance.  HENT:     Head: Normocephalic and atraumatic.     Nose: Nose normal.     Mouth/Throat:     Mouth: Mucous membranes are moist.  Eyes:     Extraocular Movements: Extraocular movements intact.     Conjunctiva/sclera: Conjunctivae normal.  Cardiovascular:     Rate and Rhythm: Normal rate.  Pulmonary:     Effort: Pulmonary effort is normal.     Breath sounds: Normal breath sounds.  Abdominal:     General: Abdomen is flat.     Palpations: Abdomen is soft.     Tenderness: There  is no abdominal tenderness.  Musculoskeletal:        General: No swelling. Normal range of motion.     Cervical back: Neck supple.  Skin:    General: Skin is warm and dry.  Neurological:     Mental Status: He is alert and oriented to person, place, and time.     Cranial Nerves: No cranial nerve deficit.     Comments: Patient reports both tingling and complete loss of sensation on his entire L side of his body. He has normal sensation on the R. He also has weakness on L arm and leg, but with very poor effort. Strength improves with encouragement.   Psychiatric:        Mood and Affect: Mood normal.      ED Results / Procedures / Treatments   Labs (all labs ordered are listed, but only abnormal results are displayed) Labs Reviewed  BASIC METABOLIC PANEL - Abnormal; Notable for the following components:      Result Value   Sodium 133 (*)    Glucose, Bld 581 (*)    Calcium 8.7 (*)    All other components within normal limits  CBG MONITORING, ED - Abnormal; Notable for the following components:   Glucose-Capillary 476 (*)    All other components within normal limits  CBG MONITORING, ED - Abnormal; Notable for the following components:   Glucose-Capillary 375 (*)    All other components within normal limits  CBC WITH DIFFERENTIAL/PLATELET  URINALYSIS, ROUTINE W REFLEX MICROSCOPIC  RAPID URINE DRUG SCREEN, HOSP PERFORMED    EKG EKG Interpretation  Date/Time:  Sunday April 20 2020 09:48:59 EDT Ventricular Rate:  79 PR Interval:    QRS Duration: 103 QT Interval:  367 QTC Calculation: 421 R Axis:   5 Text Interpretation: Sinus rhythm Probable inferior infarct, age indeterminate No significant change since last tracing Confirmed by [id number] [phone]) on 04/20/2020 10:13:05 AM   Radiology CT Head Wo Contrast  Result Date: 04/20/2020 CLINICAL DATA:  Left-sided numbness and headache EXAM: CT HEAD WITHOUT CONTRAST TECHNIQUE: Contiguous axial images were obtained from the  base of the skull through the vertex without intravenous contrast. COMPARISON:  April 15, 2020 head CT; brain MRI April 16, 2020 FINDINGS: Brain: There is stable mild diffuse atrophy for age. Prominence of the cisterna magna is an anatomic variant. Mild invagination of CSF into the sella is stable. There is no intracranial mass, hemorrhage, extra-axial fluid collection, or midline shift. Brain parenchyma appears unremarkable. No evident acute infarct. Vascular:  No hyperdense vessel. There are foci of calcification in each carotid siphon region. Skull: Bony calvarium appears intact. Sinuses/Orbits: There is mild mucosal thickening in several ethmoid air cells. Other visualized paranasal sinuses are clear. Orbits appear symmetric bilaterally. Other: Mastoid air cells are clear. There is debris in each external auditory canal. IMPRESSION: Mild diffuse atrophy for age, unchanged. Brain parenchyma appears unremarkable. No mass or hemorrhage. There are foci of arterial vascular calcification. There is slight mucosal thickening in several ethmoid air cells. Probable cerumen in each external auditory canal. Electronically Signed   By: Bretta Bang III M.D.   On: 04/20/2020 10:27    Procedures Procedures  Medications Ordered in the ED Medications  sodium chloride 0.9 % bolus 1,000 mL (0 mLs Intravenous Stopped 04/20/20 1258)     MDM Rules/Calculators/A&P MDM Patient with inconsistent history of stroke-like symptoms ongoing at least a week since visit to hospital in [place]. He has an exam that is also inconsistent. Review of old records shows a history of paresthesias on the left side. Patient report he was told he had a TIA in [place] a few years ago. Reports from a visit in [place] in 2019 available in Epic show a neg CTA then and a diagnosis of paresthesias. He has been taking Gabapentin from his doctors in [place] where he lives. He is not a candidate for tPA or intervention given duration of his symptoms.    ED Course  I have reviewed the triage vital signs and the nursing notes.  Pertinent labs & imaging results that were available during my care of the patient were reviewed by me and considered in my medical decision making (see chart for details).  Clinical Course as of Apr 20 1346  Sun Apr 20, 2020  1020 CBC is normal.    [CS]  1032 Patient is requesting pain medications. I advised him that narcotics are not indicated in the evaluation of stroke.    [CS]  1033 CT head unchanged from recent visit to [facility].    [CS]  1038 CBG is elevated but not as high as recent admission. Will give IVF and reassess.    [CS]  1057 Glucose on BMP is 581 which likely explains his paresthesia as well. Will recheck after IVF.    [CS]  1313 Glucose improving. No other acute finding and no indication for readmission. Recommend he follow up with his PCP in [place] for recheck this week.    [CS]    Clinical Course User Index [CS] Pollyann Savoy, MD    Final Clinical Impression(s) / ED Diagnoses Final diagnoses:  Paresthesia    Rx / DC Orders ED Discharge Orders    None       Pollyann Savoy, MD 04/20/20 1347

## 2020-04-20 NOTE — ED Notes (Signed)
Pt to [facility] ambulating heel to toe with cane   His movements are easy and gait is fluid   Pt reports he cares for his mother "when I can"  Also lives with mother

## 2020-04-20 NOTE — ED Notes (Signed)
Report received  Pt has been seen in several hospitals recently for same   Reports he is diabetic compliant Takes his meds as ordered  And follows a diabetic diet   He moves ad lib and appears very familiar with the NIHSS

## 2020-04-20 NOTE — ED Notes (Signed)
Critical Value  Glucose 581  EDP notified

## 2020-06-12 ENCOUNTER — Emergency Department (HOSPITAL_COMMUNITY)
Admission: EM | Admit: 2020-06-12 | Discharge: 2020-06-12 | Disposition: A | Discharge status: Home / Self Care | Payer: Medicare Other | Attending: Emergency Medicine | Admitting: Emergency Medicine

## 2020-06-12 ENCOUNTER — Other Ambulatory Visit: Payer: Self-pay

## 2020-06-12 ENCOUNTER — Encounter (HOSPITAL_COMMUNITY): Payer: Self-pay | Admitting: Emergency Medicine

## 2020-06-12 DIAGNOSIS — Z7982 Long term (current) use of aspirin: Secondary | ICD-10-CM | POA: Insufficient documentation

## 2020-06-12 DIAGNOSIS — Z79899 Other long term (current) drug therapy: Secondary | ICD-10-CM | POA: Insufficient documentation

## 2020-06-12 DIAGNOSIS — M79601 Pain in right arm: Secondary | ICD-10-CM | POA: Insufficient documentation

## 2020-06-12 DIAGNOSIS — E785 Hyperlipidemia, unspecified: Secondary | ICD-10-CM | POA: Diagnosis not present

## 2020-06-12 DIAGNOSIS — M79602 Pain in left arm: Secondary | ICD-10-CM | POA: Diagnosis not present

## 2020-06-12 DIAGNOSIS — R109 Unspecified abdominal pain: Secondary | ICD-10-CM | POA: Insufficient documentation

## 2020-06-12 DIAGNOSIS — Z794 Long term (current) use of insulin: Secondary | ICD-10-CM | POA: Insufficient documentation

## 2020-06-12 DIAGNOSIS — I10 Essential (primary) hypertension: Secondary | ICD-10-CM | POA: Insufficient documentation

## 2020-06-12 DIAGNOSIS — M79606 Pain in leg, unspecified: Secondary | ICD-10-CM | POA: Insufficient documentation

## 2020-06-12 DIAGNOSIS — M79604 Pain in right leg: Secondary | ICD-10-CM | POA: Diagnosis not present

## 2020-06-12 DIAGNOSIS — Z91010 Allergy to peanuts: Secondary | ICD-10-CM | POA: Insufficient documentation

## 2020-06-12 DIAGNOSIS — R6884 Jaw pain: Secondary | ICD-10-CM | POA: Diagnosis not present

## 2020-06-12 DIAGNOSIS — E1169 Type 2 diabetes mellitus with other specified complication: Secondary | ICD-10-CM | POA: Diagnosis not present

## 2020-06-12 DIAGNOSIS — R202 Paresthesia of skin: Secondary | ICD-10-CM | POA: Insufficient documentation

## 2020-06-12 DIAGNOSIS — R079 Chest pain, unspecified: Secondary | ICD-10-CM | POA: Diagnosis not present

## 2020-06-12 NOTE — ED Provider Notes (Signed)
[facility] EMERGENCY DEPARTMENT Provider Note   CSN: [id number] Arrival date & time: 06/12/20  1607     History Chief Complaint  Patient presents with  . Extremity Pain    Todd Campbell is a 54 y.o. male.  HPI Patient presented with bilateral arm and leg pain/paresthesias.  States they tingle.  States they started yesterday.  Also left jaw pain.  States he will occasionally have chest pain but denies being seen for recently.  States she does have a history of neuropathy.  He is currently on Neurontin.  No difficulty moving the extremities.  States the chest pain is sharp.  States sometimes his belly will hurt too.  Patient states he has been seen by his cardiologist but did not initially report that he had been seen yesterday in [facility] for chest pain abdominal pain.  He did however admitted after I questioned him about it.  States he is worried he is having a TIA or a stroke.    Past Medical History:  Diagnosis Date  . Anxiety   . Diabetes mellitus without complication (HCC)   . High cholesterol   . Hypertension   . Panic attacks   . TIA (transient ischemic attack)     There are no problems to display for this patient.   Past Surgical History:  Procedure Laterality Date  . APPENDECTOMY    . EYE SURGERY  1985       Family History  Problem Relation Age of Onset  . Diabetes Mother   . Heart attack Mother   . Diabetes Father   . Heart attack Father     Social History   Tobacco Use  . Smoking status: Never Smoker  . Smokeless tobacco: Current User    Types: Chew  Vaping Use  . Vaping Use: Never used  Substance Use Topics  . Alcohol use: Never  . Drug use: Never    Home Medications Prior to Admission medications   Medication Sig Start Date End Date Taking? Authorizing Provider  ALPRAZolam Prudy Feeler) 1 MG tablet Take 1 mg by mouth 3 (three) times daily.    [provider]  aspirin 81 MG EC tablet Take 81 mg by mouth daily.    [provider]  clonazePAM (KLONOPIN) 0.5 MG tablet Take 0.5 mg by mouth daily as needed for anxiety. 04/14/20   [provider]  gabapentin (NEURONTIN) 800 MG tablet Take 800 mg by mouth 3 (three) times daily.    [provider]  LEVEMIR FLEXTOUCH 100 UNIT/ML Pen Inject 40 Units into the skin daily.  04/03/18   [provider]  lisinopril (PRINIVIL,ZESTRIL) 5 MG tablet Take 10 mg by mouth daily. 05/11/18   [provider]  nitroGLYCERIN (NITROSTAT) 0.4 MG SL tablet Place 0.4 mg under the tongue every 5 (five) minutes as needed for chest pain.  04/14/18   [provider]  NOVOLOG MIX 70/30 FLEXPEN (70-30) 100 UNIT/ML FlexPen Inject 10 Units into the skin 3 (three) times daily after meals.  03/20/18   [provider]  omeprazole (PRILOSEC) 20 MG capsule Take 1 po BID x 2 weeks then once a day Patient taking differently: Take 20 mg by mouth 2 (two) times daily before a meal.  05/16/18   Devoria Albe, MD  pantoprazole (PROTONIX) 20 MG tablet Take 20 mg by mouth daily. 04/16/20   [provider]  rosuvastatin (CRESTOR) 40 MG tablet Take 40 mg by mouth every evening.    [provider]    Allergies    Peanut-containing drug products  Review of Systems   Review of Systems  Constitutional: Negative for appetite change.  HENT: Negative for dental problem.        Left jaw pain.  Respiratory: Negative for stridor.   Cardiovascular: Positive for chest pain.  Gastrointestinal: Positive for abdominal pain.  Genitourinary: Negative for enuresis.  Musculoskeletal: Negative for back pain.  Skin: Negative for rash.  Neurological: Positive for numbness. Negative for speech difficulty.  Psychiatric/Behavioral: Negative for confusion.    Physical Exam Updated Vital Signs BP (!) 149/92 (BP Location: Right Arm)   Pulse 85   Temp 98.2 F (36.8 C) (Oral)   Resp 18   Ht 5\' 7"  (1.702 m)   Wt 86.2 kg   SpO2 99%   BMI 29.76 kg/m   Physical  Exam Vitals and nursing note reviewed.  Constitutional:      Appearance: Normal appearance.  HENT:     Head: Atraumatic.     Comments: No jaw tenderness.    Mouth/Throat:     Mouth: Mucous membranes are moist.  Eyes:     Extraocular Movements: Extraocular movements intact.     Pupils: Pupils are equal, round, and reactive to light.  Cardiovascular:     Rate and Rhythm: Regular rhythm.  Pulmonary:     Breath sounds: No wheezing or rhonchi.  Abdominal:     Tenderness: There is no abdominal tenderness.  Musculoskeletal:     Cervical back: Neck supple.     Comments: Sensation and strength grossly intact bilateral upper and lower extremities although patient states that it feels a little tingly when I touch both his hands and his legs.  Skin:    General: Skin is warm.     Capillary Refill: Capillary refill takes less than 2 seconds.  Neurological:     Mental Status: He is alert and oriented to person, place, and time.     ED Results / Procedures / Treatments   Labs (all labs ordered are listed, but only abnormal results are displayed) Labs Reviewed - No data to display  EKG EKG Interpretation  Date/Time:  Thursday June 12 2020 16:31:58 EST Ventricular Rate:  88 PR Interval:  160 QRS Duration: 92 QT Interval:  332 QTC Calculation: 401 R Axis:   -36 Text Interpretation: Normal sinus rhythm Left axis deviation Inferior infarct , age undetermined Anterolateral infarct , age undetermined Abnormal ECG Confirmed by [id number] [phone]) on 06/12/2020 9:49:24 PM   Radiology No results found.  Procedures Procedures (including critical care time)  Medications Ordered in ED Medications - No data to display  ED Course  I have reviewed the triage vital signs and the nursing notes.  Pertinent labs & imaging results that were available during my care of the patient were reviewed by me and considered in my medical decision making (see chart for details).    MDM  Rules/Calculators/A&P                          Patient presented with pain in his left jaw and bilateral upper and lower extremities.  States he is worried is having a stroke.  States started yesterday.  Do not think this is acute ischemic event.  It is bilateral and upper and lower extremities.  Has had pains like this before.  Also had been reportedly evaluated at [facility] yesterday.  Not initially forthright with this.  Doubt ischemic  cause of the left jaw pain either.  EKG reassuring.  Was can get lab work for check for electrolyte abnormalities or other cause of paresthesias.  However patient eloped before it could be done. Final Clinical Impression(s) / ED Diagnoses Final diagnoses:  Paresthesias    Rx / DC Orders ED Discharge Orders    None       Benjiman Core, MD 06/12/20 2312

## 2020-06-12 NOTE — ED Notes (Addendum)
Lab went to draw blood, patient not in room and no where to be found. MD made aware.

## 2020-06-12 NOTE — ED Triage Notes (Signed)
Pt c/o bilateral leg and arm pain and left jaw pain that started yesterday.

## 2021-07-18 ENCOUNTER — Emergency Department (HOSPITAL_COMMUNITY)
Admission: EM | Admit: 2021-07-18 | Discharge: 2021-07-18 | Disposition: A | Discharge status: Home / Self Care | Payer: Medicare Other | Attending: Emergency Medicine | Admitting: Emergency Medicine

## 2021-07-18 ENCOUNTER — Encounter (HOSPITAL_COMMUNITY): Payer: Self-pay | Admitting: *Deleted

## 2021-07-18 ENCOUNTER — Emergency Department (HOSPITAL_COMMUNITY): Payer: Medicare Other

## 2021-07-18 DIAGNOSIS — I10 Essential (primary) hypertension: Secondary | ICD-10-CM | POA: Diagnosis not present

## 2021-07-18 DIAGNOSIS — R202 Paresthesia of skin: Secondary | ICD-10-CM | POA: Diagnosis not present

## 2021-07-18 DIAGNOSIS — U071 COVID-19: Secondary | ICD-10-CM | POA: Diagnosis not present

## 2021-07-18 DIAGNOSIS — Z79899 Other long term (current) drug therapy: Secondary | ICD-10-CM | POA: Insufficient documentation

## 2021-07-18 DIAGNOSIS — Z794 Long term (current) use of insulin: Secondary | ICD-10-CM | POA: Insufficient documentation

## 2021-07-18 DIAGNOSIS — E119 Type 2 diabetes mellitus without complications: Secondary | ICD-10-CM | POA: Insufficient documentation

## 2021-07-18 DIAGNOSIS — Z91010 Allergy to peanuts: Secondary | ICD-10-CM | POA: Insufficient documentation

## 2021-07-18 DIAGNOSIS — M79604 Pain in right leg: Secondary | ICD-10-CM | POA: Diagnosis not present

## 2021-07-18 DIAGNOSIS — R0602 Shortness of breath: Secondary | ICD-10-CM | POA: Diagnosis present

## 2021-07-18 DIAGNOSIS — Z7982 Long term (current) use of aspirin: Secondary | ICD-10-CM | POA: Insufficient documentation

## 2021-07-18 DIAGNOSIS — R531 Weakness: Secondary | ICD-10-CM | POA: Insufficient documentation

## 2021-07-18 LAB — APTT: aPTT: 24 seconds (ref 24–36)

## 2021-07-18 LAB — DIFFERENTIAL
Abs Immature Granulocytes: 0.07 10*3/uL (ref 0.00–0.07)
Basophils Absolute: 0 10*3/uL (ref 0.0–0.1)
Basophils Relative: 0 %
Eosinophils Absolute: 0.1 10*3/uL (ref 0.0–0.5)
Eosinophils Relative: 2 %
Immature Granulocytes: 1 %
Lymphocytes Relative: 29 %
Lymphs Abs: 1.7 10*3/uL (ref 0.7–4.0)
Monocytes Absolute: 0.3 10*3/uL (ref 0.1–1.0)
Monocytes Relative: 5 %
Neutro Abs: 3.8 10*3/uL (ref 1.7–7.7)
Neutrophils Relative %: 63 %

## 2021-07-18 LAB — RESP PANEL BY RT-PCR (FLU A&B, COVID) ARPGX2
Influenza A by PCR: NEGATIVE
Influenza B by PCR: NEGATIVE
SARS Coronavirus 2 by RT PCR: POSITIVE — AB

## 2021-07-18 LAB — COMPREHENSIVE METABOLIC PANEL
ALT: 24 U/L (ref 0–44)
AST: 16 U/L (ref 15–41)
Albumin: 3.3 g/dL — ABNORMAL LOW (ref 3.5–5.0)
Alkaline Phosphatase: 93 U/L (ref 38–126)
Anion gap: 8 (ref 5–15)
BUN: 7 mg/dL (ref 6–20)
CO2: 28 mmol/L (ref 22–32)
Calcium: 9 mg/dL (ref 8.9–10.3)
Chloride: 99 mmol/L (ref 98–111)
Creatinine, Ser: 0.82 mg/dL (ref 0.61–1.24)
GFR, Estimated: >60 mL/min (ref >60)
Glucose, Bld: 222 mg/dL — ABNORMAL HIGH (ref 70–99)
Potassium: 3.3 mmol/L — ABNORMAL LOW (ref 3.5–5.1)
Sodium: 135 mmol/L (ref 135–145)
Total Bilirubin: 0.3 mg/dL (ref 0.3–1.2)
Total Protein: 7 g/dL (ref 6.5–8.1)

## 2021-07-18 LAB — CBC
HCT: 37.8 % — ABNORMAL LOW (ref 39.0–52.0)
Hemoglobin: 13 g/dL (ref 13.0–17.0)
MCH: 30.1 pg (ref 26.0–34.0)
MCHC: 34.4 g/dL (ref 30.0–36.0)
MCV: 87.5 fL (ref 80.0–100.0)
Platelets: 277 10*3/uL (ref 150–400)
RBC: 4.32 MIL/uL (ref 4.22–5.81)
RDW: 12.7 % (ref 11.5–15.5)
WBC: 6 10*3/uL (ref 4.0–10.5)
nRBC: 0 % (ref 0.0–0.2)

## 2021-07-18 LAB — PROTIME-INR
INR: 0.9 (ref 0.8–1.2)
Prothrombin Time: 12.1 seconds (ref 11.4–15.2)

## 2021-07-18 LAB — ETHANOL: Alcohol, Ethyl (B): <10 mg/dL (ref <10)

## 2021-07-18 MED ORDER — MORPHINE SULFATE (PF) 4 MG/ML IV SOLN
4.0000 mg | Freq: Once | INTRAVENOUS | Status: AC
  Administered 2021-07-18: 4 mg via INTRAVENOUS
  Filled 2021-07-18: qty 1

## 2021-07-18 NOTE — ED Notes (Signed)
Attempted IV x 2 without success. Patient tolerated well. 

## 2021-07-18 NOTE — ED Triage Notes (Signed)
Left hand pain  onset yesterday, pain in right lower leg for days

## 2021-07-18 NOTE — ED Provider Notes (Signed)
[facility] EMERGENCY DEPARTMENT Provider Note   CSN: [id number] Arrival date & time: 07/18/21  1353     History  Chief Complaint  Patient presents with   Chest Pain    Rondle Lohse is a 56 y.o. male with TIA without anticoagulation who presents with concern for left upper extremity weakness which he woke up with yesterday. States that he went to bed on Thursday night feeling at his baseline but woke up yesterday with weakness in his hand, unable to extend his wrist at all.  Additionally endorses some alteration in sensation in the left upper extremity.  He does also endorse chronic right-sided leg pain that has been bothering him.  Additionally patient with multiple complaints mentioned in triage including shortness of breath, however this was not mentioned during my evaluation and states that he is at his baseline with regard to his breathing. Denies any CP or palpitations.  I personally reviewed this patient medical records.  Additionally to his TIA he has history of cholesterol, hypertension, diabetes mellitus.  He is on daily aspirin.History of recurrent LUE weakness and paresthesias for which he follows with neurology. Upcoming appointment on 07/29/20.   HPI     Home Medications Prior to Admission medications   Medication Sig Start Date End Date Taking? Authorizing Provider  ALPRAZolam Prudy Feeler) 1 MG tablet Take 1 mg by mouth 3 (three) times daily.   Yes [provider]  aspirin 81 MG EC tablet Take 81 mg by mouth daily.   Yes [provider]  clonazePAM (KLONOPIN) 0.5 MG tablet Take 0.5 mg by mouth daily as needed for anxiety. 04/14/20  Yes [provider]  ENTRESTO 49-51 MG Take 1 tablet by mouth 2 (two) times daily. 07/13/21  Yes [provider]  gabapentin (NEURONTIN) 800 MG tablet Take 800 mg by mouth 3 (three) times daily.   Yes [provider]  LEVEMIR FLEXTOUCH 100 UNIT/ML Pen Inject 40 Units into the skin daily.  04/03/18  Yes  [provider]  metoprolol succinate (TOPROL-XL) 50 MG 24 hr tablet Take 50 mg by mouth daily. 07/13/21  Yes [provider]  nitroGLYCERIN (NITROSTAT) 0.4 MG SL tablet Place 0.4 mg under the tongue every 5 (five) minutes as needed for chest pain.  04/14/18  Yes [provider]  NOVOLOG FLEXPEN 100 UNIT/ML FlexPen Inject 1-12 Units into the skin 3 (three) times daily with meals. 07/15/21  Yes [provider]  pantoprazole (PROTONIX) 20 MG tablet Take 20 mg by mouth daily. 04/16/20  Yes [provider]  rosuvastatin (CRESTOR) 40 MG tablet Take 40 mg by mouth every evening.   Yes [provider]      Allergies    Peanut-containing drug products    Review of Systems   Review of Systems  Constitutional: Negative.   HENT: Negative.    Eyes: Negative.   Respiratory: Negative.    Cardiovascular: Negative.   Gastrointestinal: Negative.   Genitourinary: Negative.   Musculoskeletal:  Positive for myalgias.  Skin: Negative.   Neurological:  Positive for weakness and numbness.  Hematological: Negative.   Psychiatric/Behavioral: Negative.     Physical Exam Updated Vital Signs BP (!) 133/104    Pulse 72    Temp 97.9 F (36.6 C) (Oral)    Resp 16    SpO2 99%  Physical Exam Vitals and nursing note reviewed.  Constitutional:      General: He is awake.     Appearance: He is obese. He is not  toxic-appearing.  HENT:     Head: Normocephalic and atraumatic.     Nose: Nose normal.     Mouth/Throat:     Mouth: Mucous membranes are moist.     Pharynx: Oropharynx is clear. Uvula midline. No oropharyngeal exudate or posterior oropharyngeal erythema.     Tonsils: No tonsillar exudate.  Eyes:     General: Lids are normal. Vision grossly intact. No visual field deficit.       Right eye: No discharge.        Left eye: No discharge.     Extraocular Movements: Extraocular movements intact.     Conjunctiva/sclera: Conjunctivae normal.     Pupils:  Pupils are equal, round, and reactive to light.     Visual Fields: Right eye visual fields normal and left eye visual fields normal.  Neck:     Trachea: Trachea and phonation normal.     Meningeal: Brudzinski's sign and Kernig's sign absent.  Cardiovascular:     Rate and Rhythm: Normal rate and regular rhythm.     Pulses: Normal pulses.     Heart sounds: Normal heart sounds. No murmur heard. Pulmonary:     Effort: Pulmonary effort is normal. No tachypnea, bradypnea, accessory muscle usage, prolonged expiration or respiratory distress.     Breath sounds: Normal breath sounds. No wheezing or rales.  Chest:     Chest wall: No mass, lacerations, deformity, swelling, tenderness, crepitus or edema.  Abdominal:     General: Bowel sounds are normal. There is no distension.     Palpations: Abdomen is soft.     Tenderness: There is no abdominal tenderness. There is no right CVA tenderness, left CVA tenderness, guarding or rebound.  Musculoskeletal:        General: No deformity.     Cervical back: Normal range of motion and neck supple.     Right lower leg: No edema.     Left lower leg: No edema.  Lymphadenopathy:     Cervical: No cervical adenopathy.  Skin:    General: Skin is warm and dry.  Neurological:     Mental Status: He is alert. Mental status is at baseline.     GCS: GCS eye subscore is 4. GCS verbal subscore is 5. GCS motor subscore is 6.     Cranial Nerves: Cranial nerves 2-12 are intact. No dysarthria or facial asymmetry.     Sensory: Sensory deficit present.     Motor: Weakness present. No tremor or pronator drift.     Coordination: Coordination abnormal. Finger-Nose-Finger Test abnormal. Heel to Shin Test normal. Rapid alternating movements normal.     Gait: Gait is intact.     Comments: Baseline disconjugate gaze with left-sided exotropia.  Diminished sensation on the left side of the face, per patient at baseline.  5/5 grip strength on the right, 1/5 on the left with left  wrist drop on initial exam. Unable to extend the wrist without resistance.  Patient cannot participate in finger to nose with left arm, apparently secondary to weakness and poor coordination with the left hand. Impaired fine motor coordination with the left hand, normal on the right.   Symmetric strength, sensation, coordination, in bilateral lower extremities.   Walks with cane at baseline for right lower extremity pain.   Psychiatric:        Mood and Affect: Mood normal.    ED Results / Procedures / Treatments   Labs (all labs ordered are listed, but only abnormal results are  displayed) Labs Reviewed  RESP PANEL BY RT-PCR (FLU A&B, COVID) ARPGX2 - Abnormal; Notable for the following components:      Result Value   SARS Coronavirus 2 by RT PCR POSITIVE (*)    All other components within normal limits  CBC - Abnormal; Notable for the following components:   HCT 37.8 (*)    All other components within normal limits  COMPREHENSIVE METABOLIC PANEL - Abnormal; Notable for the following components:   Potassium 3.3 (*)    Glucose, Bld 222 (*)    Albumin 3.3 (*)    All other components within normal limits  ETHANOL  PROTIME-INR  APTT  DIFFERENTIAL  I-STAT CHEM 8, ED    EKG EKG Interpretation  Date/Time:  Saturday July 18 2021 15:45:42 EST Ventricular Rate:  76 PR Interval:  175 QRS Duration: 102 QT Interval:  495 QTC Calculation: 557 R Axis:   155 Text Interpretation: Normal sinus rhythm Left posterior fascicular block Borderline low voltage, extremity leads Prolonged QT interval Confirmed by Eber HongMiller, Brian ([id number]) on 07/18/2021 3:50:23 PM  Radiology No results found.  Procedures Procedures  \  Medications Ordered in ED Medications  morphine 4 MG/ML injection 4 mg (4 mg Intravenous Given 07/18/21 1758)    ED Course/ Medical Decision Making/ A&P Clinical Course as of 07/21/21 0955  Sat Jul 18, 2021  1543 Consider DVT of the right calf with pain and reported  swelling. No US today at [facility].  [RS]    Clinical Course User Index [RS] Dilan Fullenwider, Eugene Gaviaebekah R, PA-C                           Medical Decision Making 56 year old male with history of TIA and left-sided paresthesias who presents with concern for reported left hand dysfunction since yesterday.  Hypertensive on intake, vital signs otherwise normal.  Cardiopulmonary exam is normal, abdominal exam is benign.  Patient with abnormal neurologic exam of though this is complicated by poor effort on the patient's part.  Concern for possible sensory deficit and weakness in the left upper extremity relative to the right.  Coordination also seems to be impaired.  Baseline disconjugate gaze with the left exotropia.  Serial neurologic evaluations throughout the night revealed normal neurovascular status of the left upper extremity.  Patient does require extensive prompting to do so but has normal neurovascular function in the left arm.  Amount and/or Complexity of Data Reviewed Labs: ordered.    Details: CBC without leukocytosis or anemia.  Metabolic panel unremarkable.  Respiratory pathogen panel positive for COVID-19. Radiology: ordered.    Details: CT of the head negative for acute intracranial abnormality ECG/medicine tests:     Details: EKG sinus rhythm, no STEMI Discussion of management or test interpretation with external provider(s): Case discussed with Dr. Thomasena Edisollins, neurologist who after chart review and serial neurologic exam with improving effort feels patient's symptoms are most consistent with his underlying neuropathy.  Patient does have outpatient neurology follow-up next week.  No further work-up warranted in the emergency department at this time per neurologist.  May continue to take his home gabapentin as necessary.  No further work-up is warranted in the ER at this time.  Risk Prescription drug management.  Upon reevaluation patient states that he feels that his symptoms are most consistent  with a flareup of his underlying neuropathy and that he feels ready to be discharged home.  I concur, given reassuring vital signs and a benign work-up, no  further work-up is warranted in the ER at this time.  Keisuke voiced understanding of his medical evaluation and treatment plan.  Each of his questions was answered to his expressed satisfaction.  Return precautions are given.  Patient is well-appearing, stable, and was discharged in good condition. This chart was dictated using voice recognition software, Dragon. Despite the best efforts of this provider to proofread and correct errors, errors may still occur which can change documentation meaning.          Final Clinical Impression(s) / ED Diagnoses Final diagnoses:  Paresthesia    Rx / DC Orders ED Discharge Orders     None         Paris Lore, PA-C 07/21/21 4742    Pricilla Loveless, MD 07/22/21 [phone]

## 2021-07-18 NOTE — Discharge Instructions (Signed)
You were seen in the ER today for your hand tingling and paresthesia.  Physical exam is very reassuring as was your work-up today.  Your symptoms are likely secondary to your baseline neuropathy and paresthesias for which you follow with neurology.  Please follow-up with your neurologist as previous scheduled for 1/25 and return to ER for develop any new weakness, difficulty speaking, lose consciousness, or you develop any other new severe symptom.

## 2021-07-18 NOTE — ED Triage Notes (Signed)
Patient has multiple complaints, including shortness of breath

## 2022-04-23 ENCOUNTER — Emergency Department (HOSPITAL_COMMUNITY): Payer: Medicaid - Out of State

## 2022-04-23 ENCOUNTER — Other Ambulatory Visit: Payer: Self-pay

## 2022-04-23 ENCOUNTER — Encounter (HOSPITAL_COMMUNITY): Payer: Self-pay | Admitting: Emergency Medicine

## 2022-04-23 ENCOUNTER — Emergency Department (HOSPITAL_COMMUNITY)
Admission: EM | Admit: 2022-04-23 | Discharge: 2022-04-23 | Disposition: A | Discharge status: Home / Self Care | Payer: Medicaid - Out of State | Attending: Emergency Medicine | Admitting: Emergency Medicine

## 2022-04-23 DIAGNOSIS — Y900 Blood alcohol level of less than 20 mg/100 ml: Secondary | ICD-10-CM | POA: Diagnosis not present

## 2022-04-23 DIAGNOSIS — Z8673 Personal history of transient ischemic attack (TIA), and cerebral infarction without residual deficits: Secondary | ICD-10-CM | POA: Insufficient documentation

## 2022-04-23 DIAGNOSIS — E1140 Type 2 diabetes mellitus with diabetic neuropathy, unspecified: Secondary | ICD-10-CM | POA: Insufficient documentation

## 2022-04-23 DIAGNOSIS — R202 Paresthesia of skin: Secondary | ICD-10-CM

## 2022-04-23 DIAGNOSIS — Z794 Long term (current) use of insulin: Secondary | ICD-10-CM | POA: Diagnosis not present

## 2022-04-23 DIAGNOSIS — E1165 Type 2 diabetes mellitus with hyperglycemia: Secondary | ICD-10-CM | POA: Diagnosis not present

## 2022-04-23 DIAGNOSIS — Z91148 Patient's other noncompliance with medication regimen for other reason: Secondary | ICD-10-CM | POA: Diagnosis not present

## 2022-04-23 DIAGNOSIS — Z91010 Allergy to peanuts: Secondary | ICD-10-CM | POA: Insufficient documentation

## 2022-04-23 DIAGNOSIS — R9431 Abnormal electrocardiogram [ECG] [EKG]: Secondary | ICD-10-CM | POA: Insufficient documentation

## 2022-04-23 DIAGNOSIS — Z79899 Other long term (current) drug therapy: Secondary | ICD-10-CM | POA: Diagnosis not present

## 2022-04-23 DIAGNOSIS — R200 Anesthesia of skin: Secondary | ICD-10-CM | POA: Diagnosis present

## 2022-04-23 DIAGNOSIS — Z7982 Long term (current) use of aspirin: Secondary | ICD-10-CM | POA: Insufficient documentation

## 2022-04-23 LAB — URINALYSIS, ROUTINE W REFLEX MICROSCOPIC
Bacteria, UA: NONE SEEN
Bilirubin Urine: NEGATIVE
Glucose, UA: >=500 mg/dL — AB
Hgb urine dipstick: NEGATIVE
Ketones, ur: NEGATIVE mg/dL
Leukocytes,Ua: NEGATIVE
Nitrite: NEGATIVE
Protein, ur: NEGATIVE mg/dL
Specific Gravity, Urine: 1.03 (ref 1.005–1.030)
pH: 6 (ref 5.0–8.0)

## 2022-04-23 LAB — DIFFERENTIAL
Abs Immature Granulocytes: 0.02 10*3/uL (ref 0.00–0.07)
Basophils Absolute: 0 10*3/uL (ref 0.0–0.1)
Basophils Relative: 1 %
Eosinophils Absolute: 0.1 10*3/uL (ref 0.0–0.5)
Eosinophils Relative: 3 %
Immature Granulocytes: 0 %
Lymphocytes Relative: 24 %
Lymphs Abs: 1.3 10*3/uL (ref 0.7–4.0)
Monocytes Absolute: 0.4 10*3/uL (ref 0.1–1.0)
Monocytes Relative: 7 %
Neutro Abs: 3.5 10*3/uL (ref 1.7–7.7)
Neutrophils Relative %: 65 %

## 2022-04-23 LAB — COMPREHENSIVE METABOLIC PANEL
ALT: 24 U/L (ref 0–44)
AST: 24 U/L (ref 15–41)
Albumin: 3.8 g/dL (ref 3.5–5.0)
Alkaline Phosphatase: 118 U/L (ref 38–126)
Anion gap: 7 (ref 5–15)
BUN: 9 mg/dL (ref 6–20)
CO2: 27 mmol/L (ref 22–32)
Calcium: 9.1 mg/dL (ref 8.9–10.3)
Chloride: 100 mmol/L (ref 98–111)
Creatinine, Ser: 0.97 mg/dL (ref 0.61–1.24)
GFR, Estimated: >60 mL/min (ref >60)
Glucose, Bld: 555 mg/dL (ref 70–99)
Potassium: 4.2 mmol/L (ref 3.5–5.1)
Sodium: 134 mmol/L — ABNORMAL LOW (ref 135–145)
Total Bilirubin: 0.6 mg/dL (ref 0.3–1.2)
Total Protein: 7.4 g/dL (ref 6.5–8.1)

## 2022-04-23 LAB — CBG MONITORING, ED
Glucose-Capillary: 580 mg/dL (ref 70–99)
Glucose-Capillary: 303 mg/dL — ABNORMAL HIGH (ref 70–99)
Glucose-Capillary: 261 mg/dL — ABNORMAL HIGH (ref 70–99)

## 2022-04-23 LAB — PROTIME-INR
INR: 0.9 (ref 0.8–1.2)
Prothrombin Time: 12 seconds (ref 11.4–15.2)

## 2022-04-23 LAB — ETHANOL: Alcohol, Ethyl (B): <10 mg/dL (ref <10)

## 2022-04-23 LAB — CBC
HCT: 41.1 % (ref 39.0–52.0)
Hemoglobin: 14 g/dL (ref 13.0–17.0)
MCH: 29.7 pg (ref 26.0–34.0)
MCHC: 34.1 g/dL (ref 30.0–36.0)
MCV: 87.3 fL (ref 80.0–100.0)
Platelets: 164 10*3/uL (ref 150–400)
RBC: 4.71 MIL/uL (ref 4.22–5.81)
RDW: 13.3 % (ref 11.5–15.5)
WBC: 5.4 10*3/uL (ref 4.0–10.5)
nRBC: 0 % (ref 0.0–0.2)

## 2022-04-23 LAB — APTT: aPTT: 26 seconds (ref 24–36)

## 2022-04-23 MED ORDER — SODIUM CHLORIDE 0.9 % IV BOLUS (SEPSIS)
1000.0000 mL | Freq: Once | INTRAVENOUS | Status: AC
  Administered 2022-04-23: 1000 mL via INTRAVENOUS

## 2022-04-23 MED ORDER — INSULIN ASPART 100 UNIT/ML IJ SOLN
10.0000 [IU] | Freq: Once | INTRAMUSCULAR | Status: AC
  Administered 2022-04-23: 10 [IU] via SUBCUTANEOUS
  Filled 2022-04-23: qty 1

## 2022-04-23 MED ORDER — SODIUM CHLORIDE 0.9% FLUSH
3.0000 mL | Freq: Once | INTRAVENOUS | Status: AC
  Administered 2022-04-23: 3 mL via INTRAVENOUS

## 2022-04-23 MED ORDER — SODIUM CHLORIDE 0.9 % IV SOLN: 1000.0000 mL | INTRAVENOUS | Status: DC

## 2022-04-23 NOTE — ED Notes (Signed)
Pt given cup of water per request.

## 2022-04-23 NOTE — ED Triage Notes (Signed)
Pt reports he had a TIA x 3 weeks ago with left side numbness/tingling since.

## 2022-04-23 NOTE — ED Provider Notes (Signed)
[facility] EMERGENCY DEPARTMENT Provider Note   CSN: [id number] Arrival date & time: 04/23/22  1623     History  Chief Complaint  Patient presents with  . Numbness    Todd Campbell is a 56 y.o. male.  Patient complains of left-sided weakness.  Patient reports he has had numbness in his left arm and his left leg for the past 3 weeks.  Patient reports he was evaluated in the hospital at [facility] and admitted for 3 days.  Patient reports he was told that he had a TIA and was advised to take aspirin every day.  Patient reports he is on gabapentin because he has neuropathy.  Patient denies any new areas of weakness he denies any headache denies any chest pain he is not having any nausea or vomiting no fever no chills.  Patient reports he has not taken his medications today.  Patient is insulin-dependent diabetic  The history is provided by the patient. No language interpreter was used.       Home Medications Prior to Admission medications   Medication Sig Start Date End Date Taking? Authorizing Provider  ALPRAZolam Prudy Feeler) 1 MG tablet Take 1 mg by mouth 3 (three) times daily.    [provider]  aspirin 81 MG EC tablet Take 81 mg by mouth daily.    [provider]  clonazePAM (KLONOPIN) 0.5 MG tablet Take 0.5 mg by mouth daily as needed for anxiety. 04/14/20   [provider]  ENTRESTO 49-51 MG Take 1 tablet by mouth 2 (two) times daily. 07/13/21   [provider]  gabapentin (NEURONTIN) 800 MG tablet Take 800 mg by mouth 3 (three) times daily.    [provider]  LEVEMIR FLEXTOUCH 100 UNIT/ML Pen Inject 40 Units into the skin daily.  04/03/18   [provider]  metoprolol succinate (TOPROL-XL) 50 MG 24 hr tablet Take 50 mg by mouth daily. 07/13/21   [provider]  nitroGLYCERIN (NITROSTAT) 0.4 MG SL tablet Place 0.4 mg under the tongue every 5 (five) minutes as needed for chest pain.  04/14/18   [provider]   NOVOLOG FLEXPEN 100 UNIT/ML FlexPen Inject 1-12 Units into the skin 3 (three) times daily with meals. 07/15/21   [provider]  pantoprazole (PROTONIX) 20 MG tablet Take 20 mg by mouth daily. 04/16/20   [provider]  rosuvastatin (CRESTOR) 40 MG tablet Take 40 mg by mouth every evening.    [provider]      Allergies    Peanut-containing drug products    Review of Systems   Review of Systems  Neurological:  Positive for numbness.  All other systems reviewed and are negative.   Physical Exam Updated Vital Signs BP (!) 145/96   Pulse 69   Temp 97.8 F (36.6 C) (Oral)   Resp 12   Ht 5\' 7"  (1.702 m)   Wt 86.2 kg   SpO2 100%   BMI 29.76 kg/m  Physical Exam Vitals and nursing note reviewed.  Constitutional:      Appearance: He is well-developed.  HENT:     Head: Normocephalic.     Right Ear: Tympanic membrane normal.     Nose: Nose normal.     Mouth/Throat:     Mouth: Mucous membranes are moist.  Eyes:     Pupils: Pupils are equal, round, and reactive to light.  Cardiovascular:     Rate and Rhythm: Normal rate and regular rhythm.  Pulses: Normal pulses.  Pulmonary:     Effort: Pulmonary effort is normal.  Abdominal:     General: Abdomen is flat. There is no distension.  Musculoskeletal:        General: Normal range of motion.     Cervical back: Normal range of motion.  Skin:    General: Skin is warm.  Neurological:     General: No focal deficit present.     Mental Status: He is alert and oriented to person, place, and time.  Psychiatric:        Mood and Affect: Mood normal.    ED Results / Procedures / Treatments   Labs (all labs ordered are listed, but only abnormal results are displayed) Labs Reviewed  CBG MONITORING, ED - Abnormal; Notable for the following components:      Result Value   Glucose-Capillary 580 (*)    All other components within normal limits  PROTIME-INR  APTT  CBC  DIFFERENTIAL  ETHANOL   COMPREHENSIVE METABOLIC PANEL    EKG None  Radiology CT HEAD WO CONTRAST (5MM)  Result Date: 04/23/2022 CLINICAL DATA:  Left-sided numbness. EXAM: CT HEAD WITHOUT CONTRAST TECHNIQUE: Contiguous axial images were obtained from the base of the skull through the vertex without intravenous contrast. RADIATION DOSE REDUCTION: This exam was performed according to the departmental dose-optimization program which includes automated exposure control, adjustment of the mA and/or kV according to patient size and/or use of iterative reconstruction technique. COMPARISON:  August 13, 2021. FINDINGS: Brain: No evidence of acute infarction, hemorrhage, hydrocephalus, extra-axial collection or mass lesion/mass effect. Vascular: No hyperdense vessel or unexpected calcification. Skull: Normal. Negative for fracture or focal lesion. Sinuses/Orbits: No acute finding. Other: None. IMPRESSION: No acute intracranial abnormality seen. Electronically Signed   By: Marijo Conception M.D.   On: 04/23/2022 17:57    Procedures Procedures    Medications Ordered in ED Medications  sodium chloride flush (NS) 0.9 % injection 3 mL (has no administration in time range)    ED Course/ Medical Decision Making/ A&P                           Medical Decision Making Patient complains of numbness to left arm and left leg.  Patient reports that this began after he had a TIA 3 weeks ago  Amount and/or Complexity of Data Reviewed External Data Reviewed: notes.    Details: Primary care notes reviewed.  Of note patient was seen at the [facility] emergency department yesterday for chest pain.  It was also noted to have an elevated glucose at that time Labs: ordered. Decision-making details documented in ED Course.    Details: Patient's laboratory evaluation showed a glucose of 555. Radiology: ordered and independent interpretation performed. Decision-making details documented in ED Course.    Details: CT scan of patient's head  shows no acute intercranial abnormality  Risk Prescription drug management. Risk Details: Urgency department course patient is given IV fluids x1 L he is given insulin 10 units subcu.  Patient's glucose decreased to 261.  I discussed the findings with patient.  I have advised him he needs to do better at controlling his glucose and being compliant with his medications.  When I reviewed his old records it seems that he has had multiple previous evaluations for paresthesias.  Patient seems to be at his baseline at this time.  I advised him to schedule follow-up with his primary care physician.  I reemphasized  to him the complications that can occur with noncompliance to his diabetes treatment regiment           Final Clinical Impression(s) / ED Diagnoses Final diagnoses:  None    Rx / DC Orders ED Discharge Orders     None         Osie Cheeks 04/23/22 2244    Lonell Grandchild, MD 04/24/22 1136

## 2022-09-15 ENCOUNTER — Encounter (HOSPITAL_COMMUNITY): Payer: Self-pay | Admitting: Radiology

## 2022-09-15 ENCOUNTER — Emergency Department (HOSPITAL_COMMUNITY)
Admission: EM | Admit: 2022-09-15 | Discharge: 2022-09-15 | Disposition: A | Discharge status: Home / Self Care | Payer: Medicare (Managed Care) | Attending: Emergency Medicine | Admitting: Emergency Medicine

## 2022-09-15 ENCOUNTER — Other Ambulatory Visit (HOSPITAL_COMMUNITY): Payer: Medicare HMO

## 2022-09-15 ENCOUNTER — Emergency Department (HOSPITAL_COMMUNITY): Payer: Medicare (Managed Care)

## 2022-09-15 ENCOUNTER — Other Ambulatory Visit: Payer: Self-pay

## 2022-09-15 DIAGNOSIS — Z7982 Long term (current) use of aspirin: Secondary | ICD-10-CM | POA: Diagnosis not present

## 2022-09-15 DIAGNOSIS — Z794 Long term (current) use of insulin: Secondary | ICD-10-CM | POA: Insufficient documentation

## 2022-09-15 DIAGNOSIS — I2510 Atherosclerotic heart disease of native coronary artery without angina pectoris: Secondary | ICD-10-CM | POA: Diagnosis not present

## 2022-09-15 DIAGNOSIS — I509 Heart failure, unspecified: Secondary | ICD-10-CM | POA: Diagnosis not present

## 2022-09-15 DIAGNOSIS — Z91010 Allergy to peanuts: Secondary | ICD-10-CM | POA: Diagnosis not present

## 2022-09-15 DIAGNOSIS — Z79899 Other long term (current) drug therapy: Secondary | ICD-10-CM | POA: Diagnosis not present

## 2022-09-15 DIAGNOSIS — R739 Hyperglycemia, unspecified: Secondary | ICD-10-CM

## 2022-09-15 DIAGNOSIS — R1033 Periumbilical pain: Secondary | ICD-10-CM | POA: Diagnosis not present

## 2022-09-15 DIAGNOSIS — G8929 Other chronic pain: Secondary | ICD-10-CM | POA: Insufficient documentation

## 2022-09-15 DIAGNOSIS — W19XXXA Unspecified fall, initial encounter: Secondary | ICD-10-CM | POA: Diagnosis not present

## 2022-09-15 DIAGNOSIS — E1165 Type 2 diabetes mellitus with hyperglycemia: Secondary | ICD-10-CM | POA: Insufficient documentation

## 2022-09-15 DIAGNOSIS — R42 Dizziness and giddiness: Secondary | ICD-10-CM | POA: Diagnosis not present

## 2022-09-15 DIAGNOSIS — M542 Cervicalgia: Secondary | ICD-10-CM | POA: Insufficient documentation

## 2022-09-15 DIAGNOSIS — R0789 Other chest pain: Secondary | ICD-10-CM | POA: Insufficient documentation

## 2022-09-15 DIAGNOSIS — S0990XA Unspecified injury of head, initial encounter: Secondary | ICD-10-CM | POA: Diagnosis present

## 2022-09-15 DIAGNOSIS — R748 Abnormal levels of other serum enzymes: Secondary | ICD-10-CM | POA: Diagnosis not present

## 2022-09-15 DIAGNOSIS — I110 Hypertensive heart disease with heart failure: Secondary | ICD-10-CM | POA: Insufficient documentation

## 2022-09-15 LAB — HEPATIC FUNCTION PANEL
ALT: 15 U/L (ref 0–44)
AST: 19 U/L (ref 15–41)
Albumin: 3.5 g/dL (ref 3.5–5.0)
Alkaline Phosphatase: 90 U/L (ref 38–126)
Bilirubin, Direct: 0.1 mg/dL (ref 0.0–0.2)
Indirect Bilirubin: 0.4 mg/dL (ref 0.3–0.9)
Total Bilirubin: 0.5 mg/dL (ref 0.3–1.2)
Total Protein: 7.1 g/dL (ref 6.5–8.1)

## 2022-09-15 LAB — BASIC METABOLIC PANEL
Anion gap: 13 (ref 5–15)
BUN: 16 mg/dL (ref 6–20)
CO2: 21 mmol/L — ABNORMAL LOW (ref 22–32)
Calcium: 8.9 mg/dL (ref 8.9–10.3)
Chloride: 100 mmol/L (ref 98–111)
Creatinine, Ser: 1.14 mg/dL (ref 0.61–1.24)
GFR, Estimated: >60 mL/min (ref >60)
Glucose, Bld: 457 mg/dL — ABNORMAL HIGH (ref 70–99)
Potassium: 3.4 mmol/L — ABNORMAL LOW (ref 3.5–5.1)
Sodium: 134 mmol/L — ABNORMAL LOW (ref 135–145)

## 2022-09-15 LAB — CBC
HCT: 40.4 % (ref 39.0–52.0)
Hemoglobin: 13.5 g/dL (ref 13.0–17.0)
MCH: 29.5 pg (ref 26.0–34.0)
MCHC: 33.4 g/dL (ref 30.0–36.0)
MCV: 88.2 fL (ref 80.0–100.0)
Platelets: 140 10*3/uL — ABNORMAL LOW (ref 150–400)
RBC: 4.58 MIL/uL (ref 4.22–5.81)
RDW: 13 % (ref 11.5–15.5)
WBC: 6.1 10*3/uL (ref 4.0–10.5)
nRBC: 0 % (ref 0.0–0.2)

## 2022-09-15 LAB — TROPONIN I (HIGH SENSITIVITY)
Troponin I (High Sensitivity): 3 ng/L (ref <18)
Troponin I (High Sensitivity): 3 ng/L (ref <18)

## 2022-09-15 LAB — LIPASE, BLOOD: Lipase: 76 U/L — ABNORMAL HIGH (ref 11–51)

## 2022-09-15 LAB — BRAIN NATRIURETIC PEPTIDE: B Natriuretic Peptide: 10 pg/mL (ref 0.0–100.0)

## 2022-09-15 LAB — CBG MONITORING, ED: Glucose-Capillary: 220 mg/dL — ABNORMAL HIGH (ref 70–99)

## 2022-09-15 MED ORDER — SODIUM CHLORIDE 0.9 % IV BOLUS
1000.0000 mL | Freq: Once | INTRAVENOUS | Status: AC
  Administered 2022-09-15: 1000 mL via INTRAVENOUS

## 2022-09-15 MED ORDER — ONDANSETRON HCL 4 MG/2ML IJ SOLN
4.0000 mg | Freq: Once | INTRAMUSCULAR | Status: AC
  Administered 2022-09-15: 4 mg via INTRAVENOUS
  Filled 2022-09-15: qty 2

## 2022-09-15 MED ORDER — DICYCLOMINE HCL 10 MG PO CAPS: 10.0000 mg | ORAL_CAPSULE | Freq: Once | ORAL | Status: DC

## 2022-09-15 MED ORDER — IOHEXOL 300 MG/ML  SOLN
100.0000 mL | Freq: Once | INTRAMUSCULAR | Status: AC | PRN
  Administered 2022-09-15: 100 mL via INTRAVENOUS

## 2022-09-15 MED ORDER — DICYCLOMINE HCL 10 MG PO CAPS
20.0000 mg | ORAL_CAPSULE | Freq: Once | ORAL | Status: DC
  Filled 2022-09-15: qty 2

## 2022-09-15 MED ORDER — MORPHINE SULFATE (PF) 4 MG/ML IV SOLN
4.0000 mg | Freq: Once | INTRAVENOUS | Status: AC
  Administered 2022-09-15: 4 mg via INTRAVENOUS
  Filled 2022-09-15: qty 1

## 2022-09-15 MED ORDER — DICYCLOMINE HCL 20 MG PO TABS: 20.0000 mg | ORAL_TABLET | Freq: Two times a day (BID) | ORAL | 0 refills | Status: AC

## 2022-09-15 MED ORDER — INSULIN ASPART 100 UNIT/ML IJ SOLN
10.0000 [IU] | Freq: Once | INTRAMUSCULAR | Status: AC
  Administered 2022-09-15: 10 [IU] via SUBCUTANEOUS
  Filled 2022-09-15: qty 1

## 2022-09-15 NOTE — ED Triage Notes (Signed)
Pt c/o left side cp x months. States worsened last night. Aching pain. Radiates into mid chest area. This pain constant since yesterday.  Pt c/o abd pain around navel and rlq area x 2 weeks. C/o nausea. C/o diarrhea daily. Denies vomiting. Loss of appetite. C/o urinary freq Pt c/o dizziness daily for months. States has fallen twice yesterday and once this am.  Pt using cane with steady gait today. Pt c/o back of neck pain and knots in head from falling. Pt denies LOC.  Pt color wnl.  Mm moist and non diaphoretic.

## 2022-09-15 NOTE — ED Notes (Signed)
Pt sitting on side of strecther eating lunch and drinking.  IVF almost complete.  Understands he has Rx to get.

## 2022-09-15 NOTE — ED Provider Notes (Signed)
[facility] Provider Note   CSN: [id number] Arrival date & time: 09/15/22  1038     History  Chief Complaint  Patient presents with   Chest Pain   Dizziness   Fall   Abdominal Pain    Todd Campbell is a 57 y.o. male with a past medical history of insulin-dependent diabetes, hypertension, hypercholesterolemia, CAD with stent placement and history of TIA presenting with multiple complaints.  He describes left-sided chest pain which has been present for months, most days which he states worsened today.  He describes an aching pain that radiates into his mid chest region.  He has shortness of breath at baseline, stating he has a diagnosis of CHF no increased shortness of breath today however.  He also endorses having abdominal pain which localizes to his periumbilical area but radiates out to his bilateral flanks.  He is concerned about his kidneys, denies history of kidney stones.  He does endorse having diarrhea daily, 1-2 episodes, nonbloody.  Denies fevers, he does have some nausea but denies vomiting.  He also reports increased urinary frequency and increased thirst.  He states he has been lightheaded for months and has sustained several falls this week, most recently yesterday evening during which he hit his head and has persistent headache and neck pain since that fall.  He denies LOC with these events, he states he has neuropathy in his feet which causes him to fall.  He uses a cane for ambulation at baseline.  He states he is compliant with his medications including his insulins.  The history is provided by the patient.       Home Medications Prior to Admission medications   Medication Sig Start Date End Date Taking? Authorizing Provider  ALPRAZolam Duanne Moron) 1 MG tablet Take 1 mg by mouth 3 (three) times daily.   Yes [provider]  aspirin 81 MG EC tablet Take 81 mg by mouth daily.   Yes [provider]  dicyclomine  (BENTYL) 20 MG tablet Take 1 tablet (20 mg total) by mouth 2 (two) times daily. 09/15/22  Yes Jayline Kilburg, Almyra Free, PA-C  ENTRESTO 24-26 MG Take 1 tablet by mouth 2 (two) times daily. 09/09/22  Yes [provider]  FARXIGA 10 MG TABS tablet Take 1 tablet by mouth daily.   Yes [provider]  gabapentin (NEURONTIN) 800 MG tablet Take 800 mg by mouth 3 (three) times daily.   Yes [provider]  metoprolol succinate (TOPROL-XL) 50 MG 24 hr tablet Take 50 mg by mouth daily. 07/13/21  Yes [provider]  nitroGLYCERIN (NITROSTAT) 0.4 MG SL tablet Place 0.4 mg under the tongue every 5 (five) minutes as needed for chest pain.  04/14/18  Yes [provider]  NOVOLOG FLEXPEN 100 UNIT/ML FlexPen Inject 7-11 Units into the skin 3 (three) times daily with meals. 07/15/21  Yes [provider]  pantoprazole (PROTONIX) 20 MG tablet Take 20 mg by mouth daily. 04/16/20  Yes [provider]  rosuvastatin (CRESTOR) 40 MG tablet Take 40 mg by mouth every evening.   Yes [provider]  spironolactone (ALDACTONE) 25 MG tablet Take 1 tablet by mouth daily.   Yes [provider]  TRESIBA FLEXTOUCH 100 UNIT/ML FlexTouch Pen Inject 52 Units into the skin daily. 08/23/22  Yes [provider]      Allergies    Peanut-containing drug products    Review of Systems   Review of Systems  Constitutional:  Negative for chills and fever.  HENT:  Negative for congestion and sore throat.   Eyes: Negative.   Respiratory:  Negative for chest tightness and shortness of breath.   Cardiovascular:  Positive for chest pain. Negative for palpitations and leg swelling.  Gastrointestinal:  Positive for abdominal distention, abdominal pain, diarrhea and nausea. Negative for vomiting.  Genitourinary: Negative.   Musculoskeletal:  Positive for neck pain. Negative for arthralgias and joint swelling.  Skin: Negative.  Negative for rash and wound.  Neurological:   Positive for light-headedness and headaches. Negative for dizziness, weakness and numbness.  Psychiatric/Behavioral: Negative.    All other systems reviewed and are negative.   Physical Exam Updated Vital Signs BP 139/88   Pulse 79   Temp 98 F (36.7 C) (Oral)   Resp 19   SpO2 94%  Physical Exam Vitals and nursing note reviewed.  Constitutional:      Appearance: He is well-developed.  HENT:     Head: Normocephalic and atraumatic.     Mouth/Throat:     Mouth: Mucous membranes are dry.  Eyes:     Extraocular Movements:     Right eye: No nystagmus.     Left eye: No nystagmus.     Conjunctiva/sclera: Conjunctivae normal.     Comments: Disconjugate gaze left.  Patient states he had eye surgery when he was age 11 to correct this and states "maybe it came back".  Cardiovascular:     Rate and Rhythm: Normal rate and regular rhythm.     Heart sounds: Normal heart sounds.  Pulmonary:     Effort: Pulmonary effort is normal.     Breath sounds: Normal breath sounds. No wheezing, rhonchi or rales.  Abdominal:     General: Bowel sounds are normal. There is distension.     Palpations: Abdomen is soft.     Tenderness: There is abdominal tenderness in the periumbilical area. There is no right CVA tenderness, left CVA tenderness or guarding.  Musculoskeletal:        General: Normal range of motion.     Cervical back: Normal range of motion.     Right lower leg: No edema.     Left lower leg: No edema.  Skin:    General: Skin is warm and dry.  Neurological:     Mental Status: He is alert.     ED Results / Procedures / Treatments   Labs (all labs ordered are listed, but only abnormal results are displayed) Labs Reviewed  BASIC METABOLIC PANEL - Abnormal; Notable for the following components:      Result Value   Sodium 134 (*)    Potassium 3.4 (*)    CO2 21 (*)    Glucose, Bld 457 (*)    All other components within normal limits  CBC - Abnormal; Notable for the following  components:   Platelets 140 (*)    All other components within normal limits  LIPASE, BLOOD - Abnormal; Notable for the following components:   Lipase 76 (*)    All other components within normal limits  CBG MONITORING, ED - Abnormal; Notable for the following components:   Glucose-Capillary 220 (*)    All other components within normal limits  HEPATIC FUNCTION PANEL  BRAIN NATRIURETIC PEPTIDE  TROPONIN I (HIGH SENSITIVITY)  TROPONIN I (HIGH SENSITIVITY)    EKG EKG Interpretation  Date/Time:  Wednesday September 15 2022 10:50:53 EDT Ventricular Rate:  74 PR Interval:  179 QRS Duration: 114 QT Interval:  414 QTC Calculation: 460 R Axis:   -34 Text Interpretation: Sinus rhythm Borderline IVCD with LAD Inferior infarct, old Consider anterior infarct No significant change since last tracing Confirmed by Isla Pence [phone]) on 09/15/2022 11:57:31 AM  Radiology CT ABDOMEN PELVIS W CONTRAST  Result Date: 09/15/2022 CLINICAL DATA:  Acute abdominal pain and left-sided chest pain. EXAM: CT ABDOMEN AND PELVIS WITH CONTRAST TECHNIQUE: Multidetector CT imaging of the abdomen and pelvis was performed using the standard protocol following bolus administration of intravenous contrast. RADIATION DOSE REDUCTION: This exam was performed according to the departmental dose-optimization program which includes automated exposure control, adjustment of the mA and/or kV according to patient size and/or use of iterative reconstruction technique. CONTRAST:  134m OMNIPAQUE IOHEXOL 300 MG/ML  SOLN COMPARISON:  CT abdomen and pelvis 07/14/2018 FINDINGS: Lower chest: No acute abnormality. Hepatobiliary: No focal liver abnormality is seen. No gallstones, gallbladder wall thickening, or biliary dilatation. Pancreas: Unremarkable. No pancreatic ductal dilatation or surrounding inflammatory changes. Spleen: Normal in size without focal abnormality. Adrenals/Urinary Tract: The bladder is markedly distended. The kidneys  and adrenal glands are within normal limits. Stomach/Bowel: Stomach is within normal limits. No evidence of bowel wall thickening, distention, or inflammatory changes. The appendix is surgically absent. There are few scattered colonic diverticula. Vascular/Lymphatic: Aortic atherosclerosis. No enlarged abdominal or pelvic lymph nodes. Reproductive: Prostate gland is enlarged. Other: No abdominal wall hernia or abnormality. No abdominopelvic ascites. Musculoskeletal: Degenerative changes affect the spine. IMPRESSION: 1. No acute localizing process in the abdomen or pelvis. 2. Marked distention of the bladder. 3. Prostatomegaly. Aortic Atherosclerosis (ICD10-I70.0). Electronically Signed   By: ARonney AstersM.D.   On: 09/15/2022 15:21   CT Cervical Spine Wo Contrast  Result Date: 09/15/2022 CLINICAL DATA:  Multiple falls, dizziness EXAM: CT CERVICAL SPINE WITHOUT CONTRAST TECHNIQUE: Multidetector CT imaging of the cervical spine was performed without intravenous contrast. Multiplanar CT image reconstructions were also generated. RADIATION DOSE REDUCTION: This exam was performed according to the departmental dose-optimization program which includes automated exposure control, adjustment of the mA and/or kV according to patient size and/or use of iterative reconstruction technique. COMPARISON:  None Available. FINDINGS: Alignment: Alignment is grossly anatomic. Skull base and vertebrae: No acute fracture. No primary bone lesion or focal pathologic process. Soft tissues and spinal canal: No prevertebral fluid or swelling. No visible canal hematoma. Disc levels: Prominent spondylosis at C4-5 and to a greater extent C5-6. At C5-6 there is significant symmetrical bilateral neural foraminal encroachment. Moderate hypertrophic changes at the C1-C2 interval. Upper chest: Airway is patent.  Lung apices are clear. Other: Reconstructed images demonstrate no additional findings. IMPRESSION: 1. No acute cervical spine fracture.  2. 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 Electronically Signed   By: MRanda NgoM.D.   On: 09/15/2022 15:21   CT Head Wo Contrast  Result Date: 09/15/2022 CLINICAL DATA:  Left-sided chest pain for several months, dizziness, multiple falls EXAM: CT HEAD WITHOUT CONTRAST TECHNIQUE: Contiguous axial images were obtained from the base of the skull through the vertex without intravenous contrast. RADIATION DOSE REDUCTION: This exam was performed according to the departmental dose-optimization program which includes automated exposure control, adjustment of the mA and/or kV according to patient size and/or use of iterative reconstruction technique. COMPARISON:  08/14/2022 FINDINGS: Brain: No acute infarct or hemorrhage. Lateral ventricles and midline structures are unremarkable. No acute extra-axial fluid collections. No mass effect. Vascular: No hyperdense vessel or unexpected calcification. Skull: Normal. Negative for fracture or focal lesion. Sinuses/Orbits: No acute finding.  Other: None. IMPRESSION: 1. Stable head CT, no acute intracranial process. Electronically Signed   By: Randa Ngo M.D.   On: 09/15/2022 15:19   DG Chest Port 1 View  Result Date: 09/15/2022 CLINICAL DATA:  Left-sided chest pain times months, worsened last night. EXAM: PORTABLE CHEST 1 VIEW COMPARISON:  09/09/2022. FINDINGS: Low lung volumes accentuate the pulmonary vasculature and cardiomediastinal silhouette. No focal airspace opacity. No pleural effusion or pneumothorax. IMPRESSION: Low lung volumes without evidence of acute cardiopulmonary disease. Electronically Signed   By: Emmit Alexanders M.D.   On: 09/15/2022 12:14    Procedures Procedures    Medications Ordered in ED Medications  dicyclomine (BENTYL) capsule 20 mg (has no administration in time range)  sodium chloride 0.9 % bolus 1,000 mL (1,000 mLs Intravenous New Bag/Given 09/15/22 1251)  insulin aspart (novoLOG) injection 10 Units (10 Units Subcutaneous Given  09/15/22 1252)  ondansetron (ZOFRAN) injection 4 mg (4 mg Intravenous Given 09/15/22 1329)  morphine (PF) 4 MG/ML injection 4 mg (4 mg Intravenous Given 09/15/22 1330)  iohexol (OMNIPAQUE) 300 MG/ML solution 100 mL (100 mLs Intravenous Contrast Given 09/15/22 1454)    ED Course/ Medical Decision Making/ A&P                             Medical Decision Making Patient here with multiple complaints, most being chronic, reporting abdominal pain and chest pain which has been present for weeks to months.  The abdominal pain is accompanied by 2-4 diarrheal episodes daily, nonbloody.  He has a history of falls secondary to peripheral neuropathy, reports he fell last night causing injury to the back of his head, he landed in his home against the floor.  Denies LOC.  Imaging today is reassuring.  He is a diabetic and his initial blood glucose today was high at 457, no anion gap, he was given IV fluids here and some subcu insulin aspart and his blood sugar improved significantly down to 220 by time of discharge.  He was able to tolerate p.o. intake.  No diarrhea while here.  Denies nausea or vomiting.  Amount and/or Complexity of Data Reviewed Labs: ordered.    Details: Labs are reassuring, glucose as mentioned above, other significant findings include a slightly elevated lipase at 76, unclear etiology, his LFTs are completely normal he has no pain in his left or right upper abdomen.  CBC is normal with a WBC count of 6.1, hemoglobin of 13.5.  His delta troponins are negative at 3.  BNP is normal range at 10.0. Radiology: ordered.    Details: Imaging reviewed, his CT abdomen has no acute findings, he did have a very distended urinary bladder, after he returned from CT he urinated 1100 cc of urine, stating he was holding his bladder until he was able to stand as he did not want to use the urinal lying down.  No obvious urinary retention issues.  CT head was also completed given his recent fall with head injury, no  acute intracranial findings.  Chest x-ray is clear. ECG/medicine tests: ordered.    Details: Sinus rhythm, rate 74, no significant changes since last tracing.  Risk Prescription drug management.           Final Clinical Impression(s) / ED Diagnoses Final diagnoses:  Periumbilical abdominal pain  Chronic chest pain  Fall, initial encounter  Minor head injury, initial encounter  Hyperglycemia    Rx / DC Orders ED Discharge Orders  Ordered    dicyclomine (BENTYL) 20 MG tablet  2 times daily        09/15/22 1555              Evalee Jefferson, Hershal Coria 09/15/22 1755    Isla Pence, MD 09/20/22 [phone]

## 2022-09-15 NOTE — ED Notes (Signed)
Pt up in room to void.  Did well.  Drinking diet coke

## 2022-09-15 NOTE — ED Notes (Signed)
Went to DC pt and he is no longer in room. Pt understood he had med to receive and paperwork.

## 2022-09-15 NOTE — ED Notes (Signed)
POC  GLU 220

## 2022-09-15 NOTE — ED Notes (Signed)
Pt eating lunch tray,

## 2022-09-15 NOTE — Discharge Instructions (Signed)
Your lab test EKG and CT imaging are all reassuring today, there is no infection in your abdomen or other identified problems as the source of your pain.  Your kidneys are functioning well, your head CT is negative for any internal injuries from your fall.  Although your blood glucose was very high when he first arrived and this has greatly improved with the treatment you received here today.  I am prescribing you medication to help you with your abdominal cramping and your diarrhea.  Plan to see your primary provider in [place] for recheck of your symptoms within the next week.

## 2022-09-15 NOTE — ED Notes (Signed)
Patient transported to CT 

## 2022-12-04 ENCOUNTER — Emergency Department (HOSPITAL_COMMUNITY): Payer: Medicare (Managed Care)

## 2022-12-04 ENCOUNTER — Encounter (HOSPITAL_COMMUNITY): Payer: Self-pay | Admitting: Emergency Medicine

## 2022-12-04 ENCOUNTER — Emergency Department (HOSPITAL_COMMUNITY)
Admission: EM | Admit: 2022-12-04 | Discharge: 2022-12-04 | Disposition: A | Discharge status: Home / Self Care | Payer: Medicare (Managed Care) | Attending: Emergency Medicine | Admitting: Emergency Medicine

## 2022-12-04 ENCOUNTER — Other Ambulatory Visit: Payer: Self-pay

## 2022-12-04 DIAGNOSIS — Z79899 Other long term (current) drug therapy: Secondary | ICD-10-CM | POA: Diagnosis not present

## 2022-12-04 DIAGNOSIS — E1165 Type 2 diabetes mellitus with hyperglycemia: Secondary | ICD-10-CM | POA: Diagnosis not present

## 2022-12-04 DIAGNOSIS — Z91010 Allergy to peanuts: Secondary | ICD-10-CM | POA: Insufficient documentation

## 2022-12-04 DIAGNOSIS — M542 Cervicalgia: Secondary | ICD-10-CM | POA: Insufficient documentation

## 2022-12-04 DIAGNOSIS — R202 Paresthesia of skin: Secondary | ICD-10-CM | POA: Insufficient documentation

## 2022-12-04 DIAGNOSIS — R0789 Other chest pain: Secondary | ICD-10-CM

## 2022-12-04 DIAGNOSIS — Z8673 Personal history of transient ischemic attack (TIA), and cerebral infarction without residual deficits: Secondary | ICD-10-CM | POA: Insufficient documentation

## 2022-12-04 DIAGNOSIS — J181 Lobar pneumonia, unspecified organism: Secondary | ICD-10-CM | POA: Diagnosis not present

## 2022-12-04 DIAGNOSIS — Z794 Long term (current) use of insulin: Secondary | ICD-10-CM | POA: Insufficient documentation

## 2022-12-04 DIAGNOSIS — Z7982 Long term (current) use of aspirin: Secondary | ICD-10-CM | POA: Insufficient documentation

## 2022-12-04 DIAGNOSIS — J189 Pneumonia, unspecified organism: Secondary | ICD-10-CM

## 2022-12-04 DIAGNOSIS — R739 Hyperglycemia, unspecified: Secondary | ICD-10-CM

## 2022-12-04 DIAGNOSIS — I10 Essential (primary) hypertension: Secondary | ICD-10-CM | POA: Diagnosis not present

## 2022-12-04 LAB — URINALYSIS, ROUTINE W REFLEX MICROSCOPIC
Bacteria, UA: NONE SEEN
Bilirubin Urine: NEGATIVE
Glucose, UA: >=500 mg/dL — AB
Hgb urine dipstick: NEGATIVE
Ketones, ur: NEGATIVE mg/dL
Leukocytes,Ua: NEGATIVE
Nitrite: NEGATIVE
Protein, ur: NEGATIVE mg/dL
Specific Gravity, Urine: 1.023 (ref 1.005–1.030)
pH: 5 (ref 5.0–8.0)

## 2022-12-04 LAB — BASIC METABOLIC PANEL
Anion gap: 10 (ref 5–15)
BUN: 16 mg/dL (ref 6–20)
CO2: 20 mmol/L — ABNORMAL LOW (ref 22–32)
Calcium: 9.1 mg/dL (ref 8.9–10.3)
Chloride: 101 mmol/L (ref 98–111)
Creatinine, Ser: 1.17 mg/dL (ref 0.61–1.24)
GFR, Estimated: >60 mL/min (ref >60)
Glucose, Bld: 443 mg/dL — ABNORMAL HIGH (ref 70–99)
Potassium: 3.7 mmol/L (ref 3.5–5.1)
Sodium: 131 mmol/L — ABNORMAL LOW (ref 135–145)

## 2022-12-04 LAB — HEPATIC FUNCTION PANEL
ALT: 21 U/L (ref 0–44)
AST: 18 U/L (ref 15–41)
Albumin: 3.4 g/dL — ABNORMAL LOW (ref 3.5–5.0)
Alkaline Phosphatase: 88 U/L (ref 38–126)
Bilirubin, Direct: <0.1 mg/dL (ref 0.0–0.2)
Total Bilirubin: 0.4 mg/dL (ref 0.3–1.2)
Total Protein: 7.2 g/dL (ref 6.5–8.1)

## 2022-12-04 LAB — CBC
HCT: 38.2 % — ABNORMAL LOW (ref 39.0–52.0)
Hemoglobin: 12.7 g/dL — ABNORMAL LOW (ref 13.0–17.0)
MCH: 29.4 pg (ref 26.0–34.0)
MCHC: 33.2 g/dL (ref 30.0–36.0)
MCV: 88.4 fL (ref 80.0–100.0)
Platelets: 200 10*3/uL (ref 150–400)
RBC: 4.32 MIL/uL (ref 4.22–5.81)
RDW: 13.6 % (ref 11.5–15.5)
WBC: 7.4 10*3/uL (ref 4.0–10.5)
nRBC: 0 % (ref 0.0–0.2)

## 2022-12-04 LAB — TROPONIN I (HIGH SENSITIVITY)
Troponin I (High Sensitivity): 3 ng/L (ref <18)
Troponin I (High Sensitivity): 3 ng/L (ref <18)

## 2022-12-04 LAB — LIPASE, BLOOD: Lipase: 44 U/L (ref 11–51)

## 2022-12-04 LAB — CBG MONITORING, ED: Glucose-Capillary: 204 mg/dL — ABNORMAL HIGH (ref 70–99)

## 2022-12-04 MED ORDER — AZITHROMYCIN 250 MG PO TABS
500.0000 mg | ORAL_TABLET | Freq: Once | ORAL | Status: AC
  Administered 2022-12-04: 500 mg via ORAL
  Filled 2022-12-04: qty 2

## 2022-12-04 MED ORDER — INSULIN ASPART 100 UNIT/ML IJ SOLN
10.0000 [IU] | Freq: Once | INTRAMUSCULAR | Status: AC
  Administered 2022-12-04: 10 [IU] via INTRAVENOUS
  Filled 2022-12-04: qty 1

## 2022-12-04 MED ORDER — SODIUM CHLORIDE 0.9 % IV BOLUS
1000.0000 mL | Freq: Once | INTRAVENOUS | Status: AC
  Administered 2022-12-04: 1000 mL via INTRAVENOUS

## 2022-12-04 MED ORDER — ONDANSETRON HCL 4 MG/2ML IJ SOLN
4.0000 mg | Freq: Once | INTRAMUSCULAR | Status: AC
  Administered 2022-12-04: 4 mg via INTRAVENOUS
  Filled 2022-12-04: qty 2

## 2022-12-04 MED ORDER — HYDROMORPHONE HCL 1 MG/ML IJ SOLN
1.0000 mg | Freq: Once | INTRAMUSCULAR | Status: AC
  Administered 2022-12-04: 1 mg via INTRAVENOUS
  Filled 2022-12-04: qty 1

## 2022-12-04 MED ORDER — SODIUM CHLORIDE 0.9 % IV SOLN: INTRAVENOUS | Status: DC

## 2022-12-04 NOTE — ED Provider Notes (Signed)
[place] [facility]ERGENCY DEPARTMENT AT [facility] Provider Note   CSN: [id number] Arrival date & time: 12/04/22  1931     History  Chief Complaint  Patient presents with   Numbness   Chest Pain    Todd Campbell is a 57 y.o. male.  Patient lives in the [place] area brought in by a friend.  Patient with a complaint of left anterior chest pain that started earlier today.  Patient with some neck pain not severe some tingling going down both arms with some swelling to his wrist.  Feels as if may be some numbness to the fingers.  No lower extremity symptoms.  Also complained of some lower abdominal discomfort suprapubic has been present for 2 days.  Patient has a history of a TIA previously.  Has no visual changes no speech problems.  The upper extremity sensory is associated with with discomfort in the wrist and pain radiating down the neck into both upper extremities.  Some more consistent with like a radicular type pain.  Past medical history for diabetes hypertension high cholesterol and history of the TIA past surgical history is appendectomy.  Patient uses smokeless tobacco.       Home Medications Prior to Admission medications   Medication Sig Start Date End Date Taking? Authorizing Provider  ALPRAZolam Prudy Feeler) 1 MG tablet Take 1 mg by mouth 3 (three) times daily.    [provider]  aspirin 81 MG EC tablet Take 81 mg by mouth daily.    [provider]  dicyclomine (BENTYL) 20 MG tablet Take 1 tablet (20 mg total) by mouth 2 (two) times daily. 09/15/22   Idol, Raynelle Fanning, PA-C  ENTRESTO 24-26 MG Take 1 tablet by mouth 2 (two) times daily. 09/09/22   [provider]  FARXIGA 10 MG TABS tablet Take 1 tablet by mouth daily.    [provider]  gabapentin (NEURONTIN) 800 MG tablet Take 800 mg by mouth 3 (three) times daily.    [provider]  metoprolol succinate (TOPROL-XL) 50 MG 24 hr tablet Take 50 mg by mouth daily. 07/13/21   [provider]  nitroGLYCERIN (NITROSTAT) 0.4 MG SL tablet Place 0.4 mg under the tongue every 5 (five) minutes as needed for chest pain.  04/14/18   [provider]  NOVOLOG FLEXPEN 100 UNIT/ML FlexPen Inject 7-11 Units into the skin 3 (three) times daily with meals. 07/15/21   [provider]  pantoprazole (PROTONIX) 20 MG tablet Take 20 mg by mouth daily. 04/16/20   [provider]  rosuvastatin (CRESTOR) 40 MG tablet Take 40 mg by mouth every evening.    [provider]  spironolactone (ALDACTONE) 25 MG tablet Take 1 tablet by mouth daily.    [provider]  TRESIBA FLEXTOUCH 100 UNIT/ML FlexTouch Pen Inject 52 Units into the skin daily. 08/23/22   [provider]      Allergies    Peanut-containing drug products    Review of Systems   Review of Systems  Constitutional:  Negative for chills and fever.  HENT:  Negative for ear pain and sore throat.   Eyes:  Negative for pain and visual disturbance.  Respiratory:  Negative for cough and shortness of breath.   Cardiovascular:  Positive for chest pain. Negative for palpitations.  Gastrointestinal:  Positive for abdominal pain. Negative for vomiting.  Genitourinary:  Positive for dysuria. Negative for hematuria.  Musculoskeletal:  Positive for neck pain. Negative for arthralgias and back pain.  Skin:  Negative for color change and rash.  Neurological:  Positive for numbness. Negative for seizures and syncope.  All other systems reviewed and are negative.   Physical Exam Updated Vital Signs BP 115/79   Pulse 84   Temp 98.2 F (36.8 C) (Oral)   Resp (!) 22   Ht 1.702 m (5\' 7" )   Wt 86.2 kg   SpO2 93%   BMI 29.76 kg/m  Physical Exam Vitals and nursing note reviewed.  Constitutional:      General: He is not in acute distress.    Appearance: Normal appearance. He is well-developed.  HENT:     Head: Normocephalic and atraumatic.     Mouth/Throat:     Mouth: Mucous membranes  are dry.  Eyes:     Extraocular Movements: Extraocular movements intact.     Conjunctiva/sclera: Conjunctivae normal.     Pupils: Pupils are equal, round, and reactive to light.  Cardiovascular:     Rate and Rhythm: Normal rate and regular rhythm.     Heart sounds: No murmur heard. Pulmonary:     Effort: Pulmonary effort is normal. No respiratory distress.     Breath sounds: Normal breath sounds. No wheezing, rhonchi or rales.  Chest:     Chest wall: No tenderness.  Abdominal:     General: There is no distension.     Palpations: Abdomen is soft.     Tenderness: There is no abdominal tenderness. There is no guarding.  Musculoskeletal:        General: No swelling.     Cervical back: Normal range of motion and neck supple. No rigidity.     Right lower leg: No edema.     Left lower leg: No edema.     Comments: Questionable swelling to both wrist.  No erythema.  Radial pulse 2+.  Questionable sensory deficit patient states it feels a little numb to some of the fingers of both hands.  Skin:    General: Skin is warm and dry.     Capillary Refill: Capillary refill takes less than 2 seconds.  Neurological:     General: No focal deficit present.     Mental Status: He is alert and oriented to person, place, and time.     Cranial Nerves: No cranial nerve deficit.     Sensory: Sensory deficit present.     Motor: No weakness.     Comments: Some questionable sensory deficit to some of the fingers of both hands.  Motor intact.  Some questionable swelling to both hands.  Psychiatric:        Mood and Affect: Mood normal.     ED Results / Procedures / Treatments   Labs (all labs ordered are listed, but only abnormal results are displayed) Labs Reviewed  BASIC METABOLIC PANEL - Abnormal; Notable for the following components:      Result Value   Sodium 131 (*)    CO2 20 (*)    Glucose, Bld 443 (*)    All other components within normal limits  CBC - Abnormal; Notable for the following  components:   Hemoglobin 12.7 (*)    HCT 38.2 (*)    All other components within normal limits  HEPATIC FUNCTION PANEL - Abnormal; Notable for the following components:   Albumin 3.4 (*)    All other components within normal limits  URINALYSIS, ROUTINE W REFLEX MICROSCOPIC - Abnormal; Notable for the following components:   Color, Urine STRAW (*)  Glucose, UA >=500 (*)    All other components within normal limits  CBG MONITORING, ED - Abnormal; Notable for the following components:   Glucose-Capillary 204 (*)    All other components within normal limits  LIPASE, BLOOD  TROPONIN I (HIGH SENSITIVITY)  TROPONIN I (HIGH SENSITIVITY)    EKG EKG Interpretation  Date/Time:  Saturday December 04 2022 19:43:59 EDT Ventricular Rate:  95 PR Interval:  175 QRS Duration: 110 QT Interval:  350 QTC Calculation: 440 R Axis:   -40 Text Interpretation: Sinus rhythm Inferior infarct, old Consider anterior infarct Baseline wander in lead(s) I III aVL Confirmed by Vanetta Mulders [phone]) on 12/04/2022 8:53:29 PM  Radiology DG Abd Acute W/Chest  Result Date: 12/04/2022 CLINICAL DATA:  Lower quadrant abdominal pain EXAM: DG ABDOMEN ACUTE WITH 1 VIEW CHEST COMPARISON:  Abdominal series 10/08/2022. CT abdomen and pelvis 12/02/2022. CT chest 05/20/2018. FINDINGS: There is some minimal faint patchy opacities in the right mid lung. Costophrenic angles are clear. No pneumothorax. Cardiomediastinal silhouette is within normal limits. There is no free air under the diaphragm. Bowel-gas pattern is nonobstructive. Air seen to the level of the sigmoid colon. There are no suspicious calcifications. There are no acute osseous abnormalities. IMPRESSION: 1. Minimal faint patchy opacities in the right mid lung, could be infectious or inflammatory. 2. Nonobstructive bowel gas pattern. Electronically Signed   By: Darliss Cheney M.D.   On: 12/04/2022 21:46   DG Chest 2 View  Result Date: 12/04/2022 CLINICAL DATA:  Chest pain  and discomfort. EXAM: CHEST - 2 VIEW COMPARISON:  12/02/2022 FINDINGS: Shallow inspiration. Heart size and pulmonary vascularity are normal. Lungs are clear. No pleural effusions. No pneumothorax. Mediastinal contours appear intact. Degenerative changes in the spine. IMPRESSION: Shallow inspiration.  No evidence of active pulmonary disease. Electronically Signed   By: Burman Nieves M.D.   On: 12/04/2022 20:10    Procedures Procedures    Medications Ordered in ED Medications  0.9 %  sodium chloride infusion ( Intravenous New Bag/Given 12/04/22 2203)  azithromycin (ZITHROMAX) tablet 500 mg (has no administration in time range)  sodium chloride 0.9 % bolus 1,000 mL (0 mLs Intravenous Stopped 12/04/22 2239)  insulin aspart (novoLOG) injection 10 Units (10 Units Intravenous Given 12/04/22 2201)  HYDROmorphone (DILAUDID) injection 1 mg (1 mg Intravenous Given 12/04/22 2201)  ondansetron (ZOFRAN) injection 4 mg (4 mg Intravenous Given 12/04/22 2201)    ED Course/ Medical Decision Making/ A&P                             Medical Decision Making Amount and/or Complexity of Data Reviewed Labs: ordered. Radiology: ordered.  Risk Prescription drug management.   Workup for an acute cardiac event is negative.  Troponins x 2 are normal.  Chest x-ray without any acute findings.  When I did sort of an acute abdominal series they felt that there may be a right middle lobe pneumonia.  Patient metabolic panel blood sugar was 443.  CO2 20 potassium good at 3.7 sodium down 131 this to be hyponatremia.  Renal function normal CBC no leukocytosis hemoglobin 12 hematocrit 38 platelets are 200 troponins were negative x 2 both were 3.  Acute abdominal series raises some current concerns about minimal faint patchy opacities in the right mid lung area.  Patient thought maybe that he had a little bit of cough and may have felt a little bit of mild shortness of breath oxygen saturations been  96% on room air low 90s in the  room as well.  EKG without any acute changes.  Urinalysis negative for urinary tract infection hepatic panel also negative for any hepatic function abnormalities.  Lipase was normal at 41.  Patient given some IV insulin and blood sugar came down to 204 also given some IV fluids.  Patient feeling better.  Patient has follow-up with primary care doctor available in the [place] area.  Do not feel that patient had an acute CVA neck symptoms seem to be associated with pain pain radiating to the arms and there is some numbness to fingers and some swelling at the wrist without erythema.  No lower extremity symptoms no incontinence.  Feel that this could be some radicular pain and can follow-up as an outpatient.  Patient states he has his blood sugar medicines available at home and he can follow-up with his primary care doctor this week to have his blood sugar rechecked.  Will be started on Zithromycin first dose provided here and will continue that in case of right middle lobe pneumonia.   Final Clinical Impression(s) / ED Diagnoses Final diagnoses:  Hyperglycemia  Atypical chest pain  Community acquired pneumonia of right middle lobe of lung    Rx / DC Orders ED Discharge Orders     None         Vanetta Mulders, MD 12/04/22 2325

## 2022-12-04 NOTE — ED Triage Notes (Signed)
Pt c/o numbness and tingling down both arms x 2 days with intermittent mild chest pain and lower abdominal/groin pain x 2 days. Pain is worse today so he decided to come for eval. Hx TIA previously, no known nerve injury to neck

## 2022-12-04 NOTE — Discharge Instructions (Signed)
Follow-up with your [facility] doctor.  Take [facility] will need to [facility] it up tomorrow.  Will give the first dose antibiotic here tonight.  No evidence of any acute cardiac problem.  Blood sugar was high so take your blood sugar medicine.  Have your doctor recheck that this week.  Return for any new or worse symptoms.
